# Patient Record
Sex: Female | Born: 1965 | Race: White | Hispanic: No | Marital: Married | Smoking: Current every day smoker
Health system: Southern US, Community
[De-identification: ages and names within clinical notes are randomized; demographics above are authoritative.]

## PROBLEM LIST (undated history)

## (undated) DIAGNOSIS — M1990 Unspecified osteoarthritis, unspecified site: Secondary | ICD-10-CM

## (undated) DIAGNOSIS — Z973 Presence of spectacles and contact lenses: Secondary | ICD-10-CM

## (undated) DIAGNOSIS — D649 Anemia, unspecified: Secondary | ICD-10-CM

## (undated) DIAGNOSIS — F32A Depression, unspecified: Secondary | ICD-10-CM

## (undated) DIAGNOSIS — K8000 Calculus of gallbladder with acute cholecystitis without obstruction: Secondary | ICD-10-CM

## (undated) DIAGNOSIS — T7840XA Allergy, unspecified, initial encounter: Secondary | ICD-10-CM

## (undated) DIAGNOSIS — F419 Anxiety disorder, unspecified: Secondary | ICD-10-CM

## (undated) DIAGNOSIS — K5790 Diverticulosis of intestine, part unspecified, without perforation or abscess without bleeding: Secondary | ICD-10-CM

## (undated) DIAGNOSIS — J449 Chronic obstructive pulmonary disease, unspecified: Secondary | ICD-10-CM

## (undated) DIAGNOSIS — N879 Dysplasia of cervix uteri, unspecified: Secondary | ICD-10-CM

## (undated) DIAGNOSIS — J302 Other seasonal allergic rhinitis: Secondary | ICD-10-CM

## (undated) DIAGNOSIS — F329 Major depressive disorder, single episode, unspecified: Secondary | ICD-10-CM

## (undated) DIAGNOSIS — K219 Gastro-esophageal reflux disease without esophagitis: Secondary | ICD-10-CM

## (undated) DIAGNOSIS — J45909 Unspecified asthma, uncomplicated: Secondary | ICD-10-CM

## (undated) DIAGNOSIS — G8929 Other chronic pain: Secondary | ICD-10-CM

## (undated) DIAGNOSIS — K529 Noninfective gastroenteritis and colitis, unspecified: Secondary | ICD-10-CM

## (undated) DIAGNOSIS — R87629 Unspecified abnormal cytological findings in specimens from vagina: Secondary | ICD-10-CM

## (undated) HISTORY — PX: CERVIX LESION DESTRUCTION: SHX591

## (undated) HISTORY — DX: Depression, unspecified: F32.A

## (undated) HISTORY — PX: CHOLECYSTECTOMY: SHX55

## (undated) HISTORY — DX: Diverticulosis of intestine, part unspecified, without perforation or abscess without bleeding: K57.90

## (undated) HISTORY — DX: Unspecified abnormal cytological findings in specimens from vagina: R87.629

## (undated) HISTORY — DX: Allergy, unspecified, initial encounter: T78.40XA

## (undated) HISTORY — DX: Chronic obstructive pulmonary disease, unspecified: J44.9

## (undated) HISTORY — DX: Major depressive disorder, single episode, unspecified: F32.9

## (undated) HISTORY — DX: Gastro-esophageal reflux disease without esophagitis: K21.9

## (undated) HISTORY — PX: COLONOSCOPY WITH PROPOFOL: SHX5780

## (undated) HISTORY — DX: Anxiety disorder, unspecified: F41.9

## (undated) HISTORY — DX: Calculus of gallbladder with acute cholecystitis without obstruction: K80.00

## (undated) HISTORY — PX: TUBAL LIGATION: SHX77

---
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
## 1993-08-03 HISTORY — PX: CERVIX LESION DESTRUCTION: SHX591

## 1994-08-03 HISTORY — PX: TUBAL LIGATION: SHX77

## 2010-12-10 ENCOUNTER — Emergency Department: Payer: Self-pay | Admitting: Emergency Medicine

## 2012-03-29 ENCOUNTER — Ambulatory Visit: Payer: Self-pay | Admitting: Family Medicine

## 2012-08-03 HISTORY — PX: CHOLECYSTECTOMY: SHX55

## 2013-01-03 ENCOUNTER — Other Ambulatory Visit: Payer: Self-pay | Admitting: General Surgery

## 2013-01-03 ENCOUNTER — Ambulatory Visit (INDEPENDENT_AMBULATORY_CARE_PROVIDER_SITE_OTHER): Payer: Medicaid Other | Admitting: General Surgery

## 2013-01-03 ENCOUNTER — Encounter: Payer: Self-pay | Admitting: General Surgery

## 2013-01-03 VITALS — BP 130/72 | HR 72 | Temp 100.5°F | Resp 12 | Ht 63.0 in | Wt 192.0 lb

## 2013-01-03 DIAGNOSIS — K8000 Calculus of gallbladder with acute cholecystitis without obstruction: Secondary | ICD-10-CM

## 2013-01-03 DIAGNOSIS — R112 Nausea with vomiting, unspecified: Secondary | ICD-10-CM

## 2013-01-03 HISTORY — DX: Calculus of gallbladder with acute cholecystitis without obstruction: K80.00

## 2013-01-03 MED ORDER — PROMETHAZINE HCL 25 MG PO TABS: 25.0000 mg | ORAL_TABLET | ORAL | Status: DC | PRN

## 2013-01-03 NOTE — Patient Instructions (Signed)
Patient advised that gallbladder is necessary at this time.   The patient will be scheduled for a cholecystectomy.

## 2013-01-03 NOTE — Progress Notes (Signed)
Patient ID: Kathy Garcia, female   DOB: 07-13-1966, 47 y.o.   MRN: [id number]  Chief Complaint  Patient presents with  . Pain    evaluation of gallbladder    HPI Kathy Garcia is a 47 y.o. female who presents for an evaluation of her gallbladder. She states she has been having abdominal pain for approximately 1 year but has gotten increasingly worse. She complains for right central abdominal pain, nausea, vomiting, and constipation. She states within in the last two weeks she has been in constant pain. The patient states she had an Ultrasound of the gallbladder at her primary care office on 12/15/12. The patient reports for the first year, she attributed it increasing reflux symptoms to a fatty food diet which she has since abandoned. In spite of that she have episodic pain 2 or 3 times a week that was fairly short lived in nature. The last 2 weeks this is become more constant right upper quadrant.  HPI  Past Medical History  Diagnosis Date  . GERD (gastroesophageal reflux disease)   . Allergy   . COPD (chronic obstructive pulmonary disease)   . Anxiety   . Depression     Past Surgical History  Procedure Laterality Date  . Tubal ligation      History reviewed. No pertinent family history.  Social History History  Substance Use Topics  . Smoking status: Former Smoker -- 1.00 packs/day for 20 years  . Smokeless tobacco: Not on file  . Alcohol Use: Yes    Allergies  Allergen Reactions  . Hydrocodone Nausea And Vomiting    Current Outpatient Prescriptions  Medication Sig Dispense Refill  . albuterol (PROVENTIL HFA;VENTOLIN HFA) 108 (90 BASE) MCG/ACT inhaler Inhale 2 puffs into the lungs every 4 (four) hours as needed for wheezing.      Marland Kitchen ALPRAZolam (XANAX) 0.5 MG tablet Take 0.5 mg by mouth 2 (two) times daily.      . beclomethasone (QVAR) 40 MCG/ACT inhaler Inhale 1 puff into the lungs 2 (two) times daily.      Marland Kitchen BIOTIN PO Take 1 tablet by mouth daily.      Marland Kitchen buPROPion  (WELLBUTRIN) 100 MG tablet Take 100 mg by mouth 2 (two) times daily.      . Cholecalciferol (VITAMIN D-3 PO) Take 1 tablet by mouth daily.      . meloxicam (MOBIC) 7.5 MG tablet Take 7.5 mg by mouth 2 (two) times daily.      . pantoprazole (PROTONIX) 40 MG tablet Take 40 mg by mouth daily.      . promethazine (PHENERGAN) 25 MG tablet Take 1 tablet (25 mg total) by mouth every 4 (four) hours as needed for nausea.  30 tablet  0   No current facility-administered medications for this visit.    Review of Systems Review of Systems  Constitutional: Positive for fever.  Respiratory: Negative.   Cardiovascular: Negative.   Gastrointestinal: Positive for nausea, vomiting and constipation.    Blood pressure 130/72, pulse 72, temperature 100.5 F (38.1 C), temperature source Oral, resp. rate 12, height 5\' 3"  (1.6 m), weight 192 lb (87.091 kg), last menstrual period 12/01/2012.  Physical Exam Physical Exam  Constitutional: She appears well-developed and well-nourished.  Eyes: Conjunctivae are normal. No scleral icterus.  Neck: Trachea normal. No mass and no thyromegaly present.  Cardiovascular: Normal rate, regular rhythm, normal heart sounds and normal pulses.   No murmur heard. Pulmonary/Chest: Effort normal and breath sounds normal.  Abdominal: Soft. Normal appearance and  bowel sounds are normal. There is tenderness in the right upper quadrant and right lower quadrant.   there is mild discomfort with firm pressure on the left side of the abdomen, but no referred pain.  Data Reviewed A single image from an outside ultrasound completed at her primary care physician's office dated 12/15/2012 shows multiple shadowing gallstones. No other images or reports are available at this time.    Assessment    Symptomatic cholelithiasis.    Plan    Arrangements have been made for cholecystectomy tomorrow. The plans for a laparoscopic procedure but the possibility of an open procedure as been  reviewed. The risks associated with surgery including bleeding, infection and biliary tract injury has been reviewed with the patient and her husband.  The patient was given a prescription for Percocet 5/325 with the inscription use one by mouth every 4 hours when necessary as needed for pain. A prescription for Phenergan was sent to her pharmacy electronically to be used as needed for nausea.       [name] 01/03/2013, 2:02 PM

## 2013-01-04 ENCOUNTER — Encounter: Payer: Self-pay | Admitting: General Surgery

## 2013-01-04 ENCOUNTER — Ambulatory Visit: Payer: Self-pay | Admitting: General Surgery

## 2013-01-04 DIAGNOSIS — K8010 Calculus of gallbladder with chronic cholecystitis without obstruction: Secondary | ICD-10-CM

## 2013-01-04 HISTORY — PX: CHOLECYSTECTOMY, LAPAROSCOPIC: SHX56

## 2013-01-04 LAB — CBC WITH DIFFERENTIAL/PLATELET
Basophil #: 0 10*3/uL (ref 0.0–0.1)
Eosinophil #: 0 10*3/uL (ref 0.0–0.7)
HCT: 32.6 % — ABNORMAL LOW (ref 35.0–47.0)
Lymphocyte #: 1.4 10*3/uL (ref 1.0–3.6)
Lymphocyte %: 14.2 %
MCH: 30.8 pg (ref 26.0–34.0)
MCHC: 33.2 g/dL (ref 32.0–36.0)
MCV: 93 fL (ref 80–100)
Monocyte #: 0.7 x10 3/mm (ref 0.2–0.9)
Neutrophil %: 77.8 %
RBC: 3.52 10*6/uL — ABNORMAL LOW (ref 3.80–5.20)
RDW: 12.8 % (ref 11.5–14.5)
WBC: 9.9 10*3/uL (ref 3.6–11.0)

## 2013-01-04 LAB — COMPREHENSIVE METABOLIC PANEL
Albumin: 3.4 g/dL (ref 3.4–5.0)
Anion Gap: 7 (ref 7–16)
BUN: 10 mg/dL (ref 7–18)
Bilirubin,Total: 0.4 mg/dL (ref 0.2–1.0)
Co2: 28 mmol/L (ref 21–32)
Osmolality: 279 (ref 275–301)
Total Protein: 7.8 g/dL (ref 6.4–8.2)

## 2013-01-05 LAB — PATHOLOGY REPORT

## 2013-01-09 ENCOUNTER — Ambulatory Visit: Payer: Self-pay | Admitting: General Surgery

## 2013-01-09 ENCOUNTER — Encounter: Payer: Self-pay | Admitting: General Surgery

## 2013-01-10 ENCOUNTER — Encounter: Payer: Self-pay | Admitting: General Surgery

## 2013-01-12 ENCOUNTER — Encounter: Payer: Self-pay | Admitting: General Surgery

## 2013-01-12 ENCOUNTER — Ambulatory Visit (INDEPENDENT_AMBULATORY_CARE_PROVIDER_SITE_OTHER): Payer: Medicaid Other | Admitting: General Surgery

## 2013-01-12 VITALS — BP 126/76 | HR 74 | Resp 16 | Ht 63.0 in | Wt 189.0 lb

## 2013-01-12 DIAGNOSIS — K8000 Calculus of gallbladder with acute cholecystitis without obstruction: Secondary | ICD-10-CM

## 2013-01-12 NOTE — Patient Instructions (Addendum)
Patient to return as needed. 

## 2013-01-12 NOTE — Progress Notes (Signed)
Patient ID: Kathy Garcia, female   DOB: Nov 28, 1965, 47 y.o.   MRN: [id number]  Chief Complaint  Patient presents with  . Routine Post Op    7-10 day post op gallbladder    HPI Kathy Garcia is a 47 y.o. female who presents for a 7-10 day post op gallbladder removal. She states she is having soreness a little pain especially at night. She states her bowels are moving but are not the consistency they usually are. She also is experiencing some nausea on a daily basis, markedly improved from prior to surgery.. She is still using her pain medication at night for comfort, primarily from the epigastric incision soreness. Occasional use of Phenergan is ongoing.  HPI  Past Medical History  Diagnosis Date  . GERD (gastroesophageal reflux disease)   . Allergy   . COPD (chronic obstructive pulmonary disease)   . Anxiety   . Depression   . Gallstones and inflammation of gallbladder without obstruction 01/03/2013    Past Surgical History  Procedure Laterality Date  . Tubal ligation    . Cholecystectomy  2014    History reviewed. No pertinent family history.  Social History History  Substance Use Topics  . Smoking status: Former Smoker -- 1.00 packs/day for 20 years  . Smokeless tobacco: Not on file  . Alcohol Use: Yes    Allergies  Allergen Reactions  . Hydrocodone Nausea And Vomiting    Current Outpatient Prescriptions  Medication Sig Dispense Refill  . albuterol (PROVENTIL HFA;VENTOLIN HFA) 108 (90 BASE) MCG/ACT inhaler Inhale 2 puffs into the lungs every 4 (four) hours as needed for wheezing.      Marland Kitchen ALPRAZolam (XANAX) 0.5 MG tablet Take 0.5 mg by mouth 2 (two) times daily.      . beclomethasone (QVAR) 40 MCG/ACT inhaler Inhale 1 puff into the lungs 2 (two) times daily.      Marland Kitchen BIOTIN PO Take 1 tablet by mouth daily.      Marland Kitchen buPROPion (WELLBUTRIN) 100 MG tablet Take 100 mg by mouth 2 (two) times daily.      . Cholecalciferol (VITAMIN D-3 PO) Take 1 tablet by mouth daily.      .  meloxicam (MOBIC) 7.5 MG tablet Take 7.5 mg by mouth 2 (two) times daily.      Marland Kitchen oxyCODONE-acetaminophen (PERCOCET/ROXICET) 5-325 MG per tablet Take 1 tablet by mouth every 4 (four) hours as needed for pain.      . pantoprazole (PROTONIX) 40 MG tablet Take 40 mg by mouth daily.      . promethazine (PHENERGAN) 25 MG tablet Take 1 tablet (25 mg total) by mouth every 4 (four) hours as needed for nausea.  30 tablet  0   No current facility-administered medications for this visit.    Review of Systems Review of Systems  Constitutional: Negative.   Respiratory: Negative.   Cardiovascular: Negative.   Gastrointestinal: Negative.     Blood pressure 126/76, pulse 74, resp. rate 16, height 5\' 3"  (1.6 m), weight 189 lb (85.73 kg), last menstrual period 12/01/2012.  Physical Exam Physical Exam  Constitutional: She appears well-developed and well-nourished.  Cardiovascular: Normal rate, regular rhythm and normal heart sounds.   Pulmonary/Chest: Effort normal and breath sounds normal.  Neurological: She is alert.   Abdominal examination shows all the port sites healing without evidence of inflammation or infection. The abdomen is soft and nontender.  Data Reviewed Pathology showed chronic cholecystitis and cholelithiasis.  Assessment    Good recovery post cholecystectomy.  Plan    The patient may return to work. 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       Greta Doom F 01/12/2013, 11:56 AM

## 2013-01-28 DIAGNOSIS — R1011 Right upper quadrant pain: Secondary | ICD-10-CM | POA: Insufficient documentation

## 2013-01-30 ENCOUNTER — Ambulatory Visit (INDEPENDENT_AMBULATORY_CARE_PROVIDER_SITE_OTHER): Payer: Medicaid Other | Admitting: General Surgery

## 2013-01-30 ENCOUNTER — Encounter: Payer: Self-pay | Admitting: General Surgery

## 2013-01-30 VITALS — BP 120/74 | HR 76 | Temp 97.9°F | Resp 12 | Ht 63.0 in | Wt 194.0 lb

## 2013-01-30 DIAGNOSIS — R1011 Right upper quadrant pain: Secondary | ICD-10-CM

## 2013-01-30 DIAGNOSIS — K8000 Calculus of gallbladder with acute cholecystitis without obstruction: Secondary | ICD-10-CM

## 2013-01-30 NOTE — Progress Notes (Signed)
Patient ID: Kathy Garcia, female   DOB: 1965-10-31, 47 y.o.   MRN: [id number]  Chief Complaint  Patient presents with  . Routine Post Op    gallbladder     HPI Kathy Garcia is a 47 y.o. female here today for her post op gallbladder surgery done on 01/04/13. Patient has COPD and has continued shortness of breath. She reports that yesterday she noted increased pain with deep inspiration in the right upper quadrant. This has since resolved. She is not appreciated a change in her pain with dietary intake. No difficulty with bowel function. She denies any black stools. She reported having some fevers and chills the last 48 hours although she had not taken her temperature.. The right upper quadrant pain is new from her January 12, 2013 postoperative examination. She is accompanied today by a family member. The patient reports that she had not been making use of her Protonix as the prescription ran out about 3 weeks ago. She was however making use of both Mobic and ibuprofen simultaneously.  HPI  Past Medical History  Diagnosis Date  . GERD (gastroesophageal reflux disease)   . Allergy   . COPD (chronic obstructive pulmonary disease)   . Anxiety   . Depression   . Gallstones and inflammation of gallbladder without obstruction 01/03/2013    Past Surgical History  Procedure Laterality Date  . Tubal ligation    . Cholecystectomy  2014    No family history on file.  Social History History  Substance Use Topics  . Smoking status: Former Smoker -- 1.00 packs/day for 20 years  . Smokeless tobacco: Not on file  . Alcohol Use: Yes    Allergies  Allergen Reactions  . Hydrocodone Nausea And Vomiting    Current Outpatient Prescriptions  Medication Sig Dispense Refill  . albuterol (PROVENTIL HFA;VENTOLIN HFA) 108 (90 BASE) MCG/ACT inhaler Inhale 2 puffs into the lungs every 4 (four) hours as needed for wheezing.      Marland Kitchen ALPRAZolam (XANAX) 0.5 MG tablet Take 0.5 mg by mouth 2 (two) times daily.       . beclomethasone (QVAR) 40 MCG/ACT inhaler Inhale 1 puff into the lungs 2 (two) times daily.      Marland Kitchen BIOTIN PO Take 1 tablet by mouth daily.      Marland Kitchen buPROPion (WELLBUTRIN) 100 MG tablet Take 100 mg by mouth 2 (two) times daily.      . Cholecalciferol (VITAMIN D-3 PO) Take 1 tablet by mouth daily.      Marland Kitchen ibuprofen (ADVIL,MOTRIN) 100 MG tablet Take 100 mg by mouth every 8 (eight) hours as needed for fever.      . meloxicam (MOBIC) 7.5 MG tablet Take 7.5 mg by mouth 2 (two) times daily.      . pantoprazole (PROTONIX) 40 MG tablet Take 40 mg by mouth daily.      . promethazine (PHENERGAN) 25 MG tablet Take 1 tablet (25 mg total) by mouth every 4 (four) hours as needed for nausea.  30 tablet  0   No current facility-administered medications for this visit.    Review of Systems Review of Systems  Constitutional: Negative.   Respiratory: Negative.   Cardiovascular: Negative.   Gastrointestinal: Positive for abdominal pain. Negative for vomiting, diarrhea, constipation, blood in stool, abdominal distention, anal bleeding and rectal pain.    Blood pressure 120/74, pulse 76, temperature 97.9 F (36.6 C), resp. rate 12, height 5\' 3"  (1.6 m), weight 194 lb (87.998 kg), last menstrual period 12/01/2012,  SpO2 97.00%.  Physical Exam Physical Exam  Constitutional: She is oriented to person, place, and time. She appears well-developed and well-nourished.  Eyes: No scleral icterus.  Cardiovascular: Normal rate and regular rhythm.   Pulmonary/Chest: Breath sounds normal. No accessory muscle usage. Not tachypneic. No respiratory distress. She exhibits no tenderness.  No pleural friction rub.  Abdominal: There is no hepatomegaly.     Tenderness just below the right costal margin in the within the body of the rectus muscle. The degree of tenderness is unchanged whether her legs are at rest or elevated off the examining table.  Lymphadenopathy:    She has no cervical adenopathy.  Neurological: She is  alert and oriented to person, place, and time.  Skin: Skin is warm and dry.    Data Reviewed Cholangiogram is completed at the time of her procedure were normal.  Assessment    Musculoskeletal pain.  Overuse of anti-inflammatories.  Discontinuation of PPI possibly exacerbated nausea.     Plan    The patient was encouraged to refill her Protonix prescription and to choose either ibuprofen or neurologic cancer anti-inflammatory effect. Local heat at the site of soreness was encouraged. I would expect her to show improvement in the next 2-3 days.          [name] 01/30/2013, 9:04 PM

## 2013-01-30 NOTE — Patient Instructions (Addendum)
The patient is aware to use a heating pad as needed for comfort.  

## 2014-01-03 DIAGNOSIS — K529 Noninfective gastroenteritis and colitis, unspecified: Secondary | ICD-10-CM | POA: Insufficient documentation

## 2014-01-03 DIAGNOSIS — K219 Gastro-esophageal reflux disease without esophagitis: Secondary | ICD-10-CM | POA: Insufficient documentation

## 2014-06-04 ENCOUNTER — Encounter: Payer: Self-pay | Admitting: General Surgery

## 2014-11-23 NOTE — Op Note (Signed)
PATIENT NAME:  Kathy RamusSMITHEY, Rhyder B MR#:  [id number] DATE OF BIRTH:  [birth year]  DATE OF PROCEDURE:  01/04/2013  PREOPERATIVE DIAGNOSIS: Chronic cholecystitis and cholelithiasis.   POSTOPERATIVE DIAGNOSIS: Chronic cholecystitis and cholelithiasis.   OPERATIVE PROCEDURE: Laparoscopic cholecystectomy with intraoperative cholangiogram.   OPERATING SURGEON: Earline MayotteJeffrey W. Mariyam Remington, M.D.   ANESTHESIA: General endotracheal under Dr. Maisie Fushomas.   ESTIMATED BLOOD LOSS: Less than 5 mL.   CLINICAL NOTE: This woman had a 2122-month history of episodic abdominal pain usually prompted by fried foods. In the last 2 weeks, she has had increasing and constant pain associated with fever and chills. She is admitted at this time for planned cholecystectomy after ultrasound obtained in a private practice office showed evidence of cholelithiasis.   OPERATIVE NOTE: With the patient under adequate general endotracheal anesthesia, the abdomen was prepped with ChloraPrep and draped. In Trendelenburg position, a Veress needle was placed through a transumbilical incision. After assuring intra-abdominal location with the hanging drop test, the abdomen was insufflated with CO2 at 10 mmHg pressure. This was later increased to 15 mmHg pressure to provide better exposure. An 11 mm Xcel port was placed in the epigastrium and two 5 mm step ports placed laterally under direct vision. Examination of the abdomen was notable for some filmy adhesions to the neck of the gallbladder and an enlarged cystic duct lymph node. The gallbladder was placed on cephalad traction. An orogastric tube was placed by the nurse anesthetist to provide better visualization. The neck of the gallbladder was cleared and the cystic duct identified. A Kumar clamp was applied and fluoroscopic cholangiogram was completed using 20 mL of one-half strength Conray 60. This showed prompt filling of the right and left hepatic ducts and free flow into the duodenum. No evidence of  retained stones. The cystic duct and branches of the cystic artery were doubly clipped and divided. The gallbladder was then removed from the liver bed making use of hook cautery dissection. It was delivered through the umbilical port site. Multiple small pigment stones were identified. No purulence was noted. After re-establishing pneumoperitoneum, the right upper quadrant was irrigated with lactated Ringer solution and good hemostasis appreciated. Attention was turned to the right lower quadrant where the patient had shown some tenderness on yesterday's exam, and there was no evidence of inflammation involving the right colon or cecum. The appendix could not be visualized. The terminal ileum was unremarkable. An 0 Maxon suture was used to close the fascial defect at the umbilicus. The abdomen was then desufflated under direct vision and ports removed. Incisions were closed with 4-0 Vicryl subcuticular suture. Benzoin, Steri-Strips, Telfa and Tegaderm dressing was then applied.   The patient tolerated the procedure well and was taken to the recovery room in stable condition.   ____________________________ Earline MayotteJeffrey W. Maguadalupe Lata, MD jwb:gb D: 01/04/2013 21:22:00 ET T: 01/04/2013 21:47:07 ET JOB#: [id number]  cc: Earline MayotteJeffrey W. Alira Fretwell, MD, <Dictator> Meindert A. Lacie ScottsNiemeyer, MD [employer] Jessina Marse MD ELECTRONICALLY SIGNED 01/05/2013 15:02

## 2017-06-16 ENCOUNTER — Encounter: Payer: Self-pay | Admitting: Emergency Medicine

## 2017-06-16 ENCOUNTER — Emergency Department
Admission: EM | Admit: 2017-06-16 | Discharge: 2017-06-16 | Disposition: A | Discharge status: Home / Self Care | Payer: Self-pay | Attending: Emergency Medicine | Admitting: Emergency Medicine

## 2017-06-16 ENCOUNTER — Emergency Department: Payer: Self-pay

## 2017-06-16 DIAGNOSIS — Z87891 Personal history of nicotine dependence: Secondary | ICD-10-CM | POA: Insufficient documentation

## 2017-06-16 DIAGNOSIS — J449 Chronic obstructive pulmonary disease, unspecified: Secondary | ICD-10-CM | POA: Insufficient documentation

## 2017-06-16 DIAGNOSIS — Z791 Long term (current) use of non-steroidal anti-inflammatories (NSAID): Secondary | ICD-10-CM | POA: Insufficient documentation

## 2017-06-16 DIAGNOSIS — Z79899 Other long term (current) drug therapy: Secondary | ICD-10-CM | POA: Insufficient documentation

## 2017-06-16 DIAGNOSIS — R079 Chest pain, unspecified: Secondary | ICD-10-CM | POA: Insufficient documentation

## 2017-06-16 LAB — BASIC METABOLIC PANEL
Anion gap: 9 (ref 5–15)
BUN: 21 mg/dL — ABNORMAL HIGH (ref 6–20)
CALCIUM: 8.9 mg/dL (ref 8.9–10.3)
CHLORIDE: 105 mmol/L (ref 101–111)
CO2: 25 mmol/L (ref 22–32)
CREATININE: 0.92 mg/dL (ref 0.44–1.00)
GFR calc Af Amer: >60 mL/min (ref >60)
GFR calc non Af Amer: >60 mL/min (ref >60)
GLUCOSE: 110 mg/dL — AB (ref 65–99)
Potassium: 3.7 mmol/L (ref 3.5–5.1)
Sodium: 139 mmol/L (ref 135–145)

## 2017-06-16 LAB — CBC
HCT: 39.4 % (ref 35.0–47.0)
HEMOGLOBIN: 13 g/dL (ref 12.0–16.0)
MCH: 32 pg (ref 26.0–34.0)
MCHC: 33 g/dL (ref 32.0–36.0)
MCV: 96.9 fL (ref 80.0–100.0)
Platelets: 246 10*3/uL (ref 150–440)
RBC: 4.06 MIL/uL (ref 3.80–5.20)
RDW: 12.9 % (ref 11.5–14.5)
WBC: 9.3 10*3/uL (ref 3.6–11.0)

## 2017-06-16 LAB — TROPONIN I

## 2017-06-16 NOTE — Discharge Instructions (Signed)
You have been seen in the emergency department today for chest pain. Your workup has shown normal results. As we discussed please follow-up with your primary care physician in the next 1-2 days for recheck. Return to the emergency department for any further chest pain, trouble breathing, or any other symptom personally concerning to yourself.  Please call the number provided for cardiology to arrange a follow-up appointment for a stress test as soon as possible.

## 2017-06-16 NOTE — ED Notes (Signed)
Pt presents with cp and sob x 1 week. States it was "very intense" on Wednesday and she felt like she couldn't breathe. She states it lasted 4 or 5 minutes and stopped. Pt states she has had continued sob and moderate cp since then. Pt states that she has had panic attacks in the past, but this feels different. She also reports that her right arm has been giving her pain for several months, as well as numbness in her fingers.

## 2017-06-16 NOTE — ED Provider Notes (Signed)
[facility] Emergency Department Provider Note  Time seen: 6:23 PM  I have reviewed the triage vital signs and the nursing notes.   HISTORY  Chief Complaint Chest Pain    HPI Kathy Garcia is a 51 y.o. female with a past medical history of anxiety, COPD, depression, gastric reflux, presents to the emergency department for chest pain.  According to the patient over the past 1 week she has been experiencing intermittent chest pain.  Denies any nausea, or diaphoresis.  She does have occasional shortness of breath when the pain occurs.  Denies any inciting event such as eating, exertion, movement or deep breathing.  Patient states her supervisor asked her to go to the emergency department to be evaluated.  Denies any significant discomfort currently.  Denies any leg pain or swelling.  Denies any shortness of breath.  Past Medical History:  Diagnosis Date  . Allergy   . Anxiety   . COPD (chronic obstructive pulmonary disease) (HCC)   . Depression   . Gallstones and inflammation of gallbladder without obstruction 01/03/2013  . GERD (gastroesophageal reflux disease)     Patient Active Problem List   Diagnosis Date Noted  . Abdominal pain, right upper quadrant 01/28/2013  . Gallstones and inflammation of gallbladder without obstruction 01/03/2013    Past Surgical History:  Procedure Laterality Date  . CHOLECYSTECTOMY  2014  . TUBAL LIGATION      Prior to Admission medications   Medication Sig Start Date End Date Taking? Authorizing Provider  albuterol (PROVENTIL HFA;VENTOLIN HFA) 108 (90 BASE) MCG/ACT inhaler Inhale 2 puffs into the lungs every 4 (four) hours as needed for wheezing.    [provider]  ALPRAZolam Prudy Feeler(XANAX) 0.5 MG tablet Take 0.5 mg by mouth 2 (two) times daily.    [provider]  beclomethasone (QVAR) 40 MCG/ACT inhaler Inhale 1 puff into the lungs 2 (two) times daily.    [provider]  BIOTIN PO Take 1 tablet by  mouth daily.    [provider]  buPROPion (WELLBUTRIN) 100 MG tablet Take 100 mg by mouth 2 (two) times daily.    [provider]  Cholecalciferol (VITAMIN D-3 PO) Take 1 tablet by mouth daily.    [provider]  ibuprofen (ADVIL,MOTRIN) 100 MG tablet Take 100 mg by mouth every 8 (eight) hours as needed for fever.    [provider]  meloxicam (MOBIC) 7.5 MG tablet Take 7.5 mg by mouth 2 (two) times daily.    [provider]  pantoprazole (PROTONIX) 40 MG tablet Take 40 mg by mouth daily.    [provider]  promethazine (PHENERGAN) 25 MG tablet Take 1 tablet (25 mg total) by mouth every 4 (four) hours as needed for nausea. 01/03/13   Earline MayotteByrnett, Jeffrey W, MD    Allergies  Allergen Reactions  . Hydrocodone Nausea And Vomiting    No family history on file.  Social History Social History   Tobacco Use  . Smoking status: Former Smoker    Packs/day: 1.00    Years: 20.00    Pack years: 20.00  . Smokeless tobacco: Never Used  Substance Use Topics  . Alcohol use: Yes  . Drug use: No    Review of Systems Constitutional: Negative for fever. Cardiovascular: Positive for intermittent chest pain over the past 1 week Respiratory: Occasional shortness of breath when the pain occurs, none currently. Gastrointestinal: Negative for abdominal pain, vomiting Musculoskeletal: Negative for leg pain or swelling All other  ROS negative  ____________________________________________   PHYSICAL EXAM:  VITAL SIGNS: ED Triage Vitals  Enc Vitals Group     BP 06/16/17 1708 (!) 145/88     Pulse Rate 06/16/17 1708 94     Resp 06/16/17 1708 20     Temp 06/16/17 1708 99.1 F (37.3 C)     Temp Source 06/16/17 1708 Oral     SpO2 06/16/17 1708 97 %     Weight 06/16/17 1705 190 lb (86.2 kg)     Height 06/16/17 1705 5\' 3"  (1.6 m)     Head Circumference --      Peak Flow --      Pain Score 06/16/17 1705 7     Pain Loc --      Pain Edu? --       Excl. in GC? --    Constitutional: Alert and oriented. Well appearing and in no distress. Eyes: Normal exam ENT   Head: Normocephalic and atraumatic.   Mouth/Throat: Mucous membranes are moist. Cardiovascular: Normal rate, regular rhythm. No murmur Respiratory: Normal respiratory effort without tachypnea nor retractions. Breath sounds are clear  Gastrointestinal: Soft and nontender. No distention.   Musculoskeletal: Nontender with normal range of motion in all extremities. No lower extremity tenderness or edema. Neurologic:  Normal speech and language. No gross focal neurologic deficits Skin:  Skin is warm, dry and intact.  Psychiatric: Mood and affect are normal.   ____________________________________________    EKG  EKG reviewed and interpreted by myself shows normal sinus rhythm at 90 bpm, narrow QRS, normal axis, normal intervals, no concerning ST changes.  ____________________________________________    RADIOLOGY  Chest x-ray negative  ____________________________________________   INITIAL IMPRESSION / ASSESSMENT AND PLAN / ED COURSE  Pertinent labs & imaging results that were available during my care of the patient were reviewed by me and considered in my medical decision making (see chart for details).  Patient presents to the emergency department for intermittent chest pain over the past 1 week.  Differential would include ACS, chest wall pain, angina, pneumonia, pneumothorax.  Overall the patient appears extremely well with a normal physical examination.  Blood work is reassuring including a negative troponin.  Chest x-ray is normal.  EKG is normal.  I reviewed the patient's records including care everywhere, largely noncontributory to today's visit, besides possibly a note from 2015 showing significant gastric reflux seen by GI.  The patient's workup and exam are normal and the patient is currently symptom-free I believe she is safe for discharge home.   However given the patient's frequency of symptoms over the past 1 week I discussed with her following up with cardiology for a stress test.  Patient is agreeable to this plan of care and will call tomorrow to arrange the next available stress test.  I provided by normal chest pain return precautions.  ____________________________________________   FINAL CLINICAL IMPRESSION(S) / ED DIAGNOSES  Chest pain    Minna AntisPaduchowski, Kahlin Mark, MD 06/16/17 1827

## 2017-06-16 NOTE — ED Notes (Signed)
PT ambulatory at time of discharge and in NAD.

## 2017-06-16 NOTE — ED Triage Notes (Signed)
Pt comes into the ED via POV c/o central chest pain x1 week.  Patient states she does have shortness of breath with the pain.  The pain is described as squeezing pressure.  Patient has a h/o anxiety and recently lost her husband and has had increased stress.  Denies any cardiac problems.  Patient is ambulatory to triage at this time with even and unlabored respirations.  Denies any dizziness, N/V.

## 2017-09-13 ENCOUNTER — Encounter: Payer: Self-pay | Admitting: Emergency Medicine

## 2017-09-13 ENCOUNTER — Emergency Department
Admission: EM | Admit: 2017-09-13 | Discharge: 2017-09-13 | Disposition: A | Discharge status: Home / Self Care | Payer: No Typology Code available for payment source | Attending: Emergency Medicine | Admitting: Emergency Medicine

## 2017-09-13 ENCOUNTER — Emergency Department: Payer: No Typology Code available for payment source

## 2017-09-13 ENCOUNTER — Other Ambulatory Visit: Payer: Self-pay

## 2017-09-13 DIAGNOSIS — Y999 Unspecified external cause status: Secondary | ICD-10-CM | POA: Diagnosis not present

## 2017-09-13 DIAGNOSIS — Y9389 Activity, other specified: Secondary | ICD-10-CM | POA: Insufficient documentation

## 2017-09-13 DIAGNOSIS — Z87891 Personal history of nicotine dependence: Secondary | ICD-10-CM | POA: Insufficient documentation

## 2017-09-13 DIAGNOSIS — Y92410 Unspecified street and highway as the place of occurrence of the external cause: Secondary | ICD-10-CM | POA: Insufficient documentation

## 2017-09-13 DIAGNOSIS — Z79899 Other long term (current) drug therapy: Secondary | ICD-10-CM | POA: Diagnosis not present

## 2017-09-13 DIAGNOSIS — S161XXA Strain of muscle, fascia and tendon at neck level, initial encounter: Secondary | ICD-10-CM | POA: Diagnosis not present

## 2017-09-13 DIAGNOSIS — J449 Chronic obstructive pulmonary disease, unspecified: Secondary | ICD-10-CM | POA: Insufficient documentation

## 2017-09-13 DIAGNOSIS — S199XXA Unspecified injury of neck, initial encounter: Secondary | ICD-10-CM | POA: Diagnosis present

## 2017-09-13 MED ORDER — METHYLPREDNISOLONE 4 MG PO TBPK: ORAL_TABLET | ORAL | 0 refills | Status: DC

## 2017-09-13 MED ORDER — METHOCARBAMOL 500 MG PO TABS: 500.0000 mg | ORAL_TABLET | Freq: Four times a day (QID) | ORAL | 0 refills | Status: DC

## 2017-09-13 NOTE — Discharge Instructions (Signed)
Follow up with Dr Odis LusterBowers if your neck is not better in 7-10 days.  Use the medication as prescribed.  Apply ice to any areas that hurt.  In 3 days you can switch to wet heat followed by ice.  Be active to help decrease the muscle soreness.  Return to the ER if you are worsening

## 2017-09-13 NOTE — ED Notes (Signed)
Pt given cup of ice and bed repositioned.
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
## 2019-02-01 ENCOUNTER — Other Ambulatory Visit: Payer: Self-pay

## 2019-02-01 ENCOUNTER — Emergency Department: Payer: No Typology Code available for payment source

## 2019-02-01 ENCOUNTER — Encounter: Payer: Self-pay | Admitting: Emergency Medicine

## 2019-02-01 ENCOUNTER — Emergency Department
Admission: EM | Admit: 2019-02-01 | Discharge: 2019-02-01 | Disposition: A | Discharge status: Home / Self Care | Payer: No Typology Code available for payment source | Attending: Emergency Medicine | Admitting: Emergency Medicine

## 2019-02-01 DIAGNOSIS — Z87891 Personal history of nicotine dependence: Secondary | ICD-10-CM | POA: Diagnosis not present

## 2019-02-01 DIAGNOSIS — N3090 Cystitis, unspecified without hematuria: Secondary | ICD-10-CM

## 2019-02-01 DIAGNOSIS — Z79899 Other long term (current) drug therapy: Secondary | ICD-10-CM | POA: Diagnosis not present

## 2019-02-01 DIAGNOSIS — Y9389 Activity, other specified: Secondary | ICD-10-CM | POA: Diagnosis not present

## 2019-02-01 DIAGNOSIS — M7918 Myalgia, other site: Secondary | ICD-10-CM | POA: Insufficient documentation

## 2019-02-01 DIAGNOSIS — Y999 Unspecified external cause status: Secondary | ICD-10-CM | POA: Insufficient documentation

## 2019-02-01 DIAGNOSIS — J449 Chronic obstructive pulmonary disease, unspecified: Secondary | ICD-10-CM | POA: Diagnosis not present

## 2019-02-01 DIAGNOSIS — R1030 Lower abdominal pain, unspecified: Secondary | ICD-10-CM | POA: Diagnosis present

## 2019-02-01 DIAGNOSIS — N3091 Cystitis, unspecified with hematuria: Secondary | ICD-10-CM | POA: Diagnosis not present

## 2019-02-01 LAB — URINALYSIS, COMPLETE (UACMP) WITH MICROSCOPIC
Bacteria, UA: NONE SEEN
Bilirubin Urine: NEGATIVE
Glucose, UA: NEGATIVE mg/dL
Ketones, ur: NEGATIVE mg/dL
Nitrite: NEGATIVE
Protein, ur: 100 mg/dL — AB
RBC / HPF: >50 RBC/hpf — ABNORMAL HIGH (ref 0–5)
Specific Gravity, Urine: 1.013 (ref 1.005–1.030)
WBC, UA: >50 WBC/hpf — ABNORMAL HIGH (ref 0–5)
pH: 7 (ref 5.0–8.0)

## 2019-02-01 LAB — CBC WITH DIFFERENTIAL/PLATELET
Abs Immature Granulocytes: 0.02 10*3/uL (ref 0.00–0.07)
Basophils Absolute: 0 10*3/uL (ref 0.0–0.1)
Basophils Relative: 0 %
Eosinophils Absolute: 0.2 10*3/uL (ref 0.0–0.5)
Eosinophils Relative: 2 %
HCT: 34.6 % — ABNORMAL LOW (ref 36.0–46.0)
Hemoglobin: 11.1 g/dL — ABNORMAL LOW (ref 12.0–15.0)
Immature Granulocytes: 0 %
Lymphocytes Relative: 34 %
Lymphs Abs: 3.6 10*3/uL (ref 0.7–4.0)
MCH: 31.8 pg (ref 26.0–34.0)
MCHC: 32.1 g/dL (ref 30.0–36.0)
MCV: 99.1 fL (ref 80.0–100.0)
Monocytes Absolute: 0.6 10*3/uL (ref 0.1–1.0)
Monocytes Relative: 6 %
Neutro Abs: 6.3 10*3/uL (ref 1.7–7.7)
Neutrophils Relative %: 58 %
Platelets: 212 10*3/uL (ref 150–400)
RBC: 3.49 MIL/uL — ABNORMAL LOW (ref 3.87–5.11)
RDW: 13 % (ref 11.5–15.5)
WBC: 10.7 10*3/uL — ABNORMAL HIGH (ref 4.0–10.5)
nRBC: 0 % (ref 0.0–0.2)

## 2019-02-01 LAB — COMPREHENSIVE METABOLIC PANEL
ALT: 22 U/L (ref 0–44)
AST: 22 U/L (ref 15–41)
Albumin: 4.2 g/dL (ref 3.5–5.0)
Alkaline Phosphatase: 83 U/L (ref 38–126)
Anion gap: 7 (ref 5–15)
BUN: 18 mg/dL (ref 6–20)
CO2: 27 mmol/L (ref 22–32)
Calcium: 8.7 mg/dL — ABNORMAL LOW (ref 8.9–10.3)
Chloride: 106 mmol/L (ref 98–111)
Creatinine, Ser: 0.77 mg/dL (ref 0.44–1.00)
GFR calc Af Amer: >60 mL/min (ref >60)
GFR calc non Af Amer: >60 mL/min (ref >60)
Glucose, Bld: 88 mg/dL (ref 70–99)
Potassium: 4.2 mmol/L (ref 3.5–5.1)
Sodium: 140 mmol/L (ref 135–145)
Total Bilirubin: 0.5 mg/dL (ref 0.3–1.2)
Total Protein: 6.7 g/dL (ref 6.5–8.1)

## 2019-02-01 MED ORDER — IOHEXOL 300 MG/ML  SOLN
100.0000 mL | Freq: Once | INTRAMUSCULAR | Status: AC | PRN
  Administered 2019-02-01: 100 mL via INTRAVENOUS
  Filled 2019-02-01: qty 100

## 2019-02-01 MED ORDER — MELOXICAM 15 MG PO TABS: 15.0000 mg | ORAL_TABLET | Freq: Every day | ORAL | 1 refills | Status: AC

## 2019-02-01 MED ORDER — CEPHALEXIN 500 MG PO CAPS: 500.0000 mg | ORAL_CAPSULE | Freq: Three times a day (TID) | ORAL | 0 refills | Status: AC

## 2019-02-01 MED ORDER — METHOCARBAMOL 500 MG PO TABS: 500.0000 mg | ORAL_TABLET | Freq: Three times a day (TID) | ORAL | 0 refills | Status: AC | PRN
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
## 2019-02-01 NOTE — ED Notes (Signed)
Patient given discharge instructions, paperwork. Patient asking about cost of medications; this RN advised patient to check [employer] for coupons that can be used to lower prices
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
## 2019-02-01 NOTE — ED Notes (Signed)
Patient transported to X-ray 

## 2019-02-03 LAB — URINE CULTURE

## 2020-04-29 ENCOUNTER — Telehealth: Payer: Self-pay | Admitting: *Deleted

## 2020-04-29 NOTE — Telephone Encounter (Signed)
Pt called schedule new patient appointment 06/13/20.  She stated she went to [facility] 9/8.  For vomiting/diarrhea  No gallbladder.  She stated they told her she had lot potassium She stated her stools are never hard. (like jam) she has been taking iron

## 2020-04-29 NOTE — Telephone Encounter (Signed)
If she is limited to Thursday and Friday appointments she may want to consider a different provider since I'm not in the office on Fridays.

## 2020-04-29 NOTE — Telephone Encounter (Signed)
And this weekend she had abd pain.  Pt wanted to know if she could be worked in.  Pt only wants Thursday Friday appointments.  Or what do you recommend she do until her appointment here   LOW Potassium (not lot potassium)

## 2020-05-01 NOTE — Telephone Encounter (Signed)
Kathy Garcia Can pt be worked in for new patient appointment with you

## 2020-05-01 NOTE — Telephone Encounter (Signed)
Next available, not working her in.

## 2020-05-01 NOTE — Telephone Encounter (Signed)
Left message asking pt to call office  °

## 2020-06-13 ENCOUNTER — Ambulatory Visit (INDEPENDENT_AMBULATORY_CARE_PROVIDER_SITE_OTHER): Payer: Self-pay | Admitting: Family Medicine

## 2020-06-13 ENCOUNTER — Encounter: Payer: Self-pay | Admitting: Family Medicine

## 2020-06-13 ENCOUNTER — Other Ambulatory Visit: Payer: Self-pay

## 2020-06-13 VITALS — BP 120/68 | HR 71 | Temp 97.4°F | Ht 63.5 in | Wt 186.5 lb

## 2020-06-13 DIAGNOSIS — I10 Essential (primary) hypertension: Secondary | ICD-10-CM

## 2020-06-13 DIAGNOSIS — E669 Obesity, unspecified: Secondary | ICD-10-CM

## 2020-06-13 DIAGNOSIS — G43909 Migraine, unspecified, not intractable, without status migrainosus: Secondary | ICD-10-CM | POA: Insufficient documentation

## 2020-06-13 DIAGNOSIS — F331 Major depressive disorder, recurrent, moderate: Secondary | ICD-10-CM

## 2020-06-13 DIAGNOSIS — F329 Major depressive disorder, single episode, unspecified: Secondary | ICD-10-CM | POA: Insufficient documentation

## 2020-06-13 DIAGNOSIS — F411 Generalized anxiety disorder: Secondary | ICD-10-CM

## 2020-06-13 DIAGNOSIS — E66811 Obesity, class 1: Secondary | ICD-10-CM

## 2020-06-13 DIAGNOSIS — K529 Noninfective gastroenteritis and colitis, unspecified: Secondary | ICD-10-CM

## 2020-06-13 DIAGNOSIS — J449 Chronic obstructive pulmonary disease, unspecified: Secondary | ICD-10-CM

## 2020-06-13 DIAGNOSIS — Z13220 Encounter for screening for lipoid disorders: Secondary | ICD-10-CM

## 2020-06-13 MED ORDER — ALBUTEROL SULFATE HFA 108 (90 BASE) MCG/ACT IN AERS: 2.0000 | INHALATION_SPRAY | RESPIRATORY_TRACT | 1 refills | Status: DC | PRN

## 2020-06-13 NOTE — Assessment & Plan Note (Signed)
Notes last treated >5 years and cost is a major concern. She is managing OK but will potentially be interested in treatment when she has insurance. Will follow-up in 3 months

## 2020-06-13 NOTE — Assessment & Plan Note (Addendum)
Rare breathing events. Interested in quitting smoking. Vomiting with coughing fits - advised trying albuterol prn to see if that improves/reduces symptoms

## 2020-06-13 NOTE — Patient Instructions (Signed)
Diarrhea - would recommend we work this up - Call to schedule a lab appointment once you have insurance   Make a f/u visit after you schedule the labs

## 2020-06-13 NOTE — Assessment & Plan Note (Signed)
Notes diarrhea x 5+ years since gallbladder removal. Discussed getting work-up to r/u celiac disease and Inflammatory bowel disease. At this point lower suspicion for infection given duration of symptoms. And will evaluate for thyroid and liver disease. Due to cost will postpone evaluation for 3 months. GI referral of no cause determined.

## 2020-06-13 NOTE — Assessment & Plan Note (Signed)
BP normal today, but notes elevated at home and does have prior elevated numbers. She will defer bloodwork due to cost and check in 3 months w/ f/u visit at that time to recheck.

## 2020-06-13 NOTE — Assessment & Plan Note (Signed)
Previously on xanax but concerns over teenagers possibly taking her pills so stopped. Still with significant anxiety, but due to insurance would like to defer treatment. F/u 3 months

## 2020-06-13 NOTE — Progress Notes (Signed)
Subjective:     Kathy Garcia is a 54 y.o. female presenting for Establish Care (uncontrollable bowels ), Abdominal Pain (Right lower ), Nausea, and Hypertension     HPI   #Abdominal pain - nausea impacts her eating  - is eating roast beef sandwich from [employer] as this sounds good - has been losing weight due to poor eating - RLQ  - went to the ER in September due to nausea/diarrhea/vomiting - has not had a formed stool since 2015 - abdominal pain comes an goes since 04/03/2020 - pain 2 times per week and improves with bowel movement - BM 3-6 times per day - nausea - every day, first thing in the AM - improves with zofran, also improves with BM - vomiting - with coughing or talking   #HTN - has been noticing HTN at home - works at nursing home - moving to [employer] - has been checking at work and it has elevated over the last 2 weeks  Review of Systems   Social History   Tobacco Use  Smoking Status Current Every Day Smoker  . Packs/day: 0.50  . Years: 20.00  . Pack years: 10.00  Smokeless Tobacco Never Used  Tobacco Comment   1 ppd x 20 years, quit for 10 years, restarted 2018        Objective:    BP Readings from Last 3 Encounters:  06/13/20 120/68  02/01/19 (!) 154/82  09/13/17 120/80   Wt Readings from Last 3 Encounters:  06/13/20 186 lb 8 oz (84.6 kg)  02/01/19 190 lb (86.2 kg)  09/13/17 190 lb (86.2 kg)    BP 120/68   Pulse 71   Temp (!) 97.4 F (36.3 C) (Temporal)   Ht 5' 3.5" (1.613 m)   Wt 186 lb 8 oz (84.6 kg)   LMP 12/01/2012 (Exact Date)   SpO2 97%   BMI 32.52 kg/m    Physical Exam Constitutional:      General: She is not in acute distress.    Appearance: She is well-developed. She is not diaphoretic.  HENT:     Right Ear: External ear normal.     Left Ear: External ear normal.     Nose: Nose normal.  Eyes:     Conjunctiva/sclera: Conjunctivae normal.  Cardiovascular:     Rate and Rhythm: Normal rate and regular rhythm.      Heart sounds: No murmur heard.   Pulmonary:     Effort: Pulmonary effort is normal.     Breath sounds: No decreased air movement. Decreased breath sounds present. No wheezing.  Abdominal:     General: Bowel sounds are normal.     Palpations: Abdomen is soft.     Tenderness: There is abdominal tenderness in the right lower quadrant. There is no guarding or rebound.  Musculoskeletal:     Cervical back: Neck supple.  Skin:    General: Skin is warm and dry.     Capillary Refill: Capillary refill takes less than 2 seconds.  Neurological:     Mental Status: She is alert. Mental status is at baseline.  Psychiatric:        Mood and Affect: Mood normal.        Behavior: Behavior normal.     GAD 7 : Generalized Anxiety Score 06/13/2020  Nervous, Anxious, on Edge 2  Control/stop worrying 3  Worry too much - different things 2  Trouble relaxing 2  Restless 1  Easily annoyed or irritable  3  Afraid - awful might happen 1  Total GAD 7 Score 14  Anxiety Difficulty Very difficult    Depression screen PHQ 2/9 06/13/2020  Decreased Interest 2  Down, Depressed, Hopeless 2  PHQ - 2 Score 4  Altered sleeping 3  Tired, decreased energy 3  Change in appetite 3  Feeling bad or failure about yourself  2  Trouble concentrating 3  Moving slowly or fidgety/restless 1  Suicidal thoughts 0  PHQ-9 Score 19  Difficult doing work/chores Somewhat difficult         Assessment & Plan:   Problem List Items Addressed This Visit      Cardiovascular and Mediastinum   Essential hypertension    BP normal today, but notes elevated at home and does have prior elevated numbers. She will defer bloodwork due to cost and check in 3 months w/ f/u visit at that time to recheck.       Migraines     Respiratory   COPD (chronic obstructive pulmonary disease) (HCC)    Rare breathing events. Interested in quitting smoking. Vomiting with coughing fits - advised trying albuterol prn to see if that  improves/reduces symptoms      Relevant Medications   albuterol (VENTOLIN HFA) 108 (90 Base) MCG/ACT inhaler     Digestive   Chronic diarrhea - Primary    Notes diarrhea x 5+ years since gallbladder removal. Discussed getting work-up to r/u celiac disease and Inflammatory bowel disease. At this point lower suspicion for infection given duration of symptoms. And will evaluate for thyroid and liver disease. Due to cost will postpone evaluation for 3 months. GI referral of no cause determined.         Other   Generalized anxiety disorder    Previously on xanax but concerns over teenagers possibly taking her pills so stopped. Still with significant anxiety, but due to insurance would like to defer treatment. F/u 3 months      Major depressive disorder    Notes last treated >5 years and cost is a major concern. She is managing OK but will potentially be interested in treatment when she has insurance. Will follow-up in 3 months          Return in about 3 months (around 09/13/2020).  Lynnda Child, MD  This visit occurred during the SARS-CoV-2 public health emergency.  Safety protocols were in place, including screening questions prior to the visit, additional usage of staff PPE, and extensive cleaning of exam room while observing appropriate contact time as indicated for disinfecting solutions.

## 2020-09-10 ENCOUNTER — Ambulatory Visit: Payer: Self-pay | Admitting: Family Medicine

## 2020-09-10 DIAGNOSIS — Z0289 Encounter for other administrative examinations: Secondary | ICD-10-CM

## 2021-01-15 ENCOUNTER — Other Ambulatory Visit: Payer: Self-pay

## 2021-01-15 ENCOUNTER — Ambulatory Visit (INDEPENDENT_AMBULATORY_CARE_PROVIDER_SITE_OTHER): Payer: Self-pay | Admitting: Family Medicine

## 2021-01-15 VITALS — BP 128/70 | HR 74 | Temp 97.9°F | Ht 65.0 in | Wt 166.5 lb

## 2021-01-15 DIAGNOSIS — K529 Noninfective gastroenteritis and colitis, unspecified: Secondary | ICD-10-CM

## 2021-01-15 DIAGNOSIS — G8929 Other chronic pain: Secondary | ICD-10-CM

## 2021-01-15 DIAGNOSIS — Z1159 Encounter for screening for other viral diseases: Secondary | ICD-10-CM

## 2021-01-15 DIAGNOSIS — R109 Unspecified abdominal pain: Secondary | ICD-10-CM

## 2021-01-15 DIAGNOSIS — Z720 Tobacco use: Secondary | ICD-10-CM | POA: Insufficient documentation

## 2021-01-15 DIAGNOSIS — M542 Cervicalgia: Secondary | ICD-10-CM

## 2021-01-15 DIAGNOSIS — Z114 Encounter for screening for human immunodeficiency virus [HIV]: Secondary | ICD-10-CM

## 2021-01-15 DIAGNOSIS — I10 Essential (primary) hypertension: Secondary | ICD-10-CM

## 2021-01-15 DIAGNOSIS — K219 Gastro-esophageal reflux disease without esophagitis: Secondary | ICD-10-CM

## 2021-01-15 DIAGNOSIS — R634 Abnormal weight loss: Secondary | ICD-10-CM | POA: Insufficient documentation

## 2021-01-15 LAB — COMPREHENSIVE METABOLIC PANEL
ALT: 12 U/L (ref 0–35)
AST: 14 U/L (ref 0–37)
Albumin: 4.2 g/dL (ref 3.5–5.2)
Alkaline Phosphatase: 67 U/L (ref 39–117)
BUN: 23 mg/dL (ref 6–23)
CO2: 30 mEq/L (ref 19–32)
Calcium: 8.6 mg/dL (ref 8.4–10.5)
Chloride: 104 mEq/L (ref 96–112)
Creatinine, Ser: 0.82 mg/dL (ref 0.40–1.20)
GFR: 79.63 mL/min (ref >60.00)
Glucose, Bld: 89 mg/dL (ref 70–99)
Potassium: 3.8 mEq/L (ref 3.5–5.1)
Sodium: 140 mEq/L (ref 135–145)
Total Bilirubin: 0.4 mg/dL (ref 0.2–1.2)
Total Protein: 6.7 g/dL (ref 6.0–8.3)

## 2021-01-15 LAB — CBC WITH DIFFERENTIAL/PLATELET
Basophils Absolute: 0 10*3/uL (ref 0.0–0.1)
Basophils Relative: 0.5 % (ref 0.0–3.0)
Eosinophils Absolute: 0.1 10*3/uL (ref 0.0–0.7)
Eosinophils Relative: 2.3 % (ref 0.0–5.0)
HCT: 35.2 % — ABNORMAL LOW (ref 36.0–46.0)
Hemoglobin: 12 g/dL (ref 12.0–15.0)
Lymphocytes Relative: 27 % (ref 12.0–46.0)
Lymphs Abs: 1.8 10*3/uL (ref 0.7–4.0)
MCHC: 34 g/dL (ref 30.0–36.0)
MCV: 98.6 fl (ref 78.0–100.0)
Monocytes Absolute: 0.3 10*3/uL (ref 0.1–1.0)
Monocytes Relative: 4.8 % (ref 3.0–12.0)
Neutro Abs: 4.3 10*3/uL (ref 1.4–7.7)
Neutrophils Relative %: 65.4 % (ref 43.0–77.0)
Platelets: 209 10*3/uL (ref 150.0–400.0)
RBC: 3.57 Mil/uL — ABNORMAL LOW (ref 3.87–5.11)
RDW: 12.8 % (ref 11.5–15.5)
WBC: 6.5 10*3/uL (ref 4.0–10.5)

## 2021-01-15 LAB — HIGH SENSITIVITY CRP: CRP, High Sensitivity: 3.51 mg/L (ref 0.000–5.000)

## 2021-01-15 LAB — LIPID PANEL
Cholesterol: 138 mg/dL (ref 0–200)
HDL: 30.1 mg/dL — ABNORMAL LOW (ref >39.00)
LDL Cholesterol: 91 mg/dL (ref 0–99)
NonHDL: 108.3
Total CHOL/HDL Ratio: 5
Triglycerides: 85 mg/dL (ref 0.0–149.0)
VLDL: 17 mg/dL (ref 0.0–40.0)

## 2021-01-15 NOTE — Assessment & Plan Note (Signed)
Did not have time for full exam today and challenging historian. Discussed starting with PT and if not improving anticipate returning for exam and imaging to further evaluate - reports prior nerve study but then also not sure what was done (>5 years ago)

## 2021-01-15 NOTE — Assessment & Plan Note (Signed)
10+ months of severe diarrhea. 5-10 BM daily and regular use of imodium. Occasional blood in stool. Discussed work-up for celiac, inflammatory bowel disease, and infectious. Red flags include 20lb weight loss will refer to GI for colon cancer screening and evaluation.

## 2021-01-15 NOTE — Progress Notes (Signed)
Subjective:     Kathy Garcia is a 55 y.o. female presenting for Abdominal Pain     HPI  #Diarrhea - with abdominal pain - started august 2021 - ER 04/2020 - losing weight - will take iron and imodium  - poorly formed stool and constant - >10 BM daily - taking iron daily - with 3-4 bm daily - loose stool - occasional blood in the stool - nausea w/ vomiting - vomiting 3-4 times a week - weight down 20 lbs - notes lower abdominal pain - no heartburn or reflux - endorses joint pain - knees, elbow, hip pain  Has been smoking more Not eating as much  #Neck pain - chronic - several years - also with whole hand numbness - though improving today - thinks she had a nerve study but also not sure what was done in 2015/2014 - did not like that provider or work-up  Review of Systems   Social History   Tobacco Use  Smoking Status Every Day   Packs/day: 0.50   Years: 20.00   Pack years: 10.00   Types: Cigarettes  Smokeless Tobacco Never  Tobacco Comments   1 ppd x 20 years, quit for 10 years, restarted 2018        Objective:    BP Readings from Last 3 Encounters:  01/15/21 128/70  06/13/20 120/68  02/01/19 (!) 154/82   Wt Readings from Last 3 Encounters:  01/15/21 166 lb 8 oz (75.5 kg)  06/13/20 186 lb 8 oz (84.6 kg)  02/01/19 190 lb (86.2 kg)    BP 128/70   Pulse 74   Temp 97.9 F (36.6 C) (Temporal)   Ht 5\' 5"  (1.651 m)   Wt 166 lb 8 oz (75.5 kg)   LMP 12/01/2012 (Exact Date)   SpO2 94%   BMI 27.71 kg/m    Physical Exam Constitutional:      General: She is not in acute distress.    Appearance: She is well-developed. She is not diaphoretic.  HENT:     Head: Normocephalic and atraumatic.     Right Ear: External ear normal.     Left Ear: External ear normal.     Nose: Nose normal.  Eyes:     Conjunctiva/sclera: Conjunctivae normal.     Pupils: Pupils are equal, round, and reactive to light.  Cardiovascular:     Rate and Rhythm: Normal  rate and regular rhythm.     Heart sounds: No murmur heard. Pulmonary:     Effort: Pulmonary effort is normal. No respiratory distress.     Breath sounds: Normal breath sounds. No wheezing.  Abdominal:     General: Abdomen is flat. Bowel sounds are normal. There is no distension.     Palpations: Abdomen is soft.     Tenderness: There is generalized abdominal tenderness. There is no guarding or rebound.  Musculoskeletal:     Cervical back: Neck supple.  Skin:    General: Skin is warm and dry.     Capillary Refill: Capillary refill takes less than 2 seconds.  Neurological:     Mental Status: She is alert. Mental status is at baseline.  Psychiatric:        Mood and Affect: Mood normal.        Behavior: Behavior normal.          Assessment & Plan:   Problem List Items Addressed This Visit       Cardiovascular and Mediastinum   Essential  hypertension    BP controlled. No medication currenlty       Relevant Orders   Lipid panel     Digestive   GERD (gastroesophageal reflux disease)    Pt denies heartburn/reflux but has increased smoking and notes chronic nausea with occasional vomiting. Trial of omeprazole to see if symptoms improve.        Chronic diarrhea - Primary    10+ months of severe diarrhea. 5-10 BM daily and regular use of imodium. Occasional blood in stool. Discussed work-up for celiac, inflammatory bowel disease, and infectious. Red flags include 20lb weight loss will refer to GI for colon cancer screening and evaluation.        Relevant Orders   Celiac Pnl 2 rflx Endomysial Ab Ttr   Comprehensive metabolic panel   CBC with Differential   High sensitivity CRP   Ambulatory referral to Gastroenterology   Gastrointestinal Pathogen Panel PCR   Giardia/cryptosporidium (EIA)   CALPROTECTIN     Other   Chronic neck pain    Did not have time for full exam today and challenging historian. Discussed starting with PT and if not improving anticipate returning  for exam and imaging to further evaluate - reports prior nerve study but then also not sure what was done (>5 years ago)       Relevant Orders   Ambulatory referral to Physical Therapy   Tobacco abuse    Pt with increased to 1ppd again. With weight loss will anticipate plan for lung cancer screening once GI work-up is underway.        Weight loss    20 lbs. Likely 2/2 to GI losses and poor PO intake, however, she is not up to date on any cancer screening and is a smoker. Number for mammogram, referral to GI. Anticipate lung cancer screening referral and will have her return for pap        Other Visit Diagnoses     Chronic abdominal pain       Relevant Orders   Celiac Pnl 2 rflx Endomysial Ab Ttr   Comprehensive metabolic panel   CBC with Differential   High sensitivity CRP   Ambulatory referral to Gastroenterology   Gastrointestinal Pathogen Panel PCR   Giardia/cryptosporidium (EIA)   CALPROTECTIN   Need for hepatitis C screening test       Relevant Orders   Hepatitis C antibody   Encounter for screening for HIV       Relevant Orders   HIV Antibody (routine testing w rflx)        Return in about 6 weeks (around 02/26/2021) for neck pain and stomach issues.  Lynnda Child, MD  This visit occurred during the SARS-CoV-2 public health emergency.  Safety protocols were in place, including screening questions prior to the visit, additional usage of staff PPE, and extensive cleaning of exam room while observing appropriate contact time as indicated for disinfecting solutions.

## 2021-01-15 NOTE — Assessment & Plan Note (Signed)
BP controlled. No medication currenlty

## 2021-01-15 NOTE — Assessment & Plan Note (Signed)
20 lbs. Likely 2/2 to GI losses and poor PO intake, however, she is not up to date on any cancer screening and is a smoker. Number for mammogram, referral to GI. Anticipate lung cancer screening referral and will have her return for pap

## 2021-01-15 NOTE — Assessment & Plan Note (Signed)
Pt with increased to 1ppd again. With weight loss will anticipate plan for lung cancer screening once GI work-up is underway.

## 2021-01-15 NOTE — Patient Instructions (Addendum)
Diarrhea - will get work-up to rule out infectious and evaluate for inflammatory causes   Nausea - try Omeprazole 20 mg daily for 2 weeks  Neck pain - referral to physical therapy - return in 6 weeks to check in   #Referral I have placed a referral to a specialist for you. You should receive a phone call from the specialty office. Make sure your voicemail is not full and that if you are able to answer your phone to unknown or new numbers.   It may take up to 2 weeks to hear about the referral. If you do not hear anything in 2 weeks, please call our office and ask to speak with the referral coordinator.   Please call the location of your choice from the menu below to schedule your Mammogram and/or Bone Density appointment.      [facility]   Phone:  [phone]   [address]                                                                            [place], [place] [address]                                            Services: 3D Mammogram and Bone Density

## 2021-01-15 NOTE — Assessment & Plan Note (Signed)
Pt denies heartburn/reflux but has increased smoking and notes chronic nausea with occasional vomiting. Trial of omeprazole to see if symptoms improve.

## 2021-01-17 ENCOUNTER — Other Ambulatory Visit (INDEPENDENT_AMBULATORY_CARE_PROVIDER_SITE_OTHER): Payer: Self-pay

## 2021-01-17 DIAGNOSIS — R109 Unspecified abdominal pain: Secondary | ICD-10-CM

## 2021-01-17 DIAGNOSIS — K529 Noninfective gastroenteritis and colitis, unspecified: Secondary | ICD-10-CM

## 2021-01-17 DIAGNOSIS — G8929 Other chronic pain: Secondary | ICD-10-CM

## 2021-01-21 LAB — CELIAC PNL 2 RFLX ENDOMYSIAL AB TTR
(tTG) Ab, IgA: <1 U/mL
(tTG) Ab, IgG: <1 U/mL
Endomysial Ab IgA: NEGATIVE
Gliadin IgA: 2 U/mL
Gliadin IgG: <1 U/mL
Immunoglobulin A: 265 mg/dL (ref 47–310)

## 2021-01-21 LAB — HIV ANTIBODY (ROUTINE TESTING W REFLEX): HIV 1&2 Ab, 4th Generation: NONREACTIVE

## 2021-01-21 LAB — HEPATITIS C ANTIBODY
Hepatitis C Ab: NONREACTIVE
SIGNAL TO CUT-OFF: 0 (ref <1.00)

## 2021-01-23 LAB — GIARDIA AND CRYPTOSPORIDIUM ANTIGEN PANEL
MICRO NUMBER:: 12022265
RESULT:: NOT DETECTED
SPECIMEN QUALITY:: ADEQUATE
Specimen Quality:: ADEQUATE
micro Number:: 12022264

## 2021-01-23 LAB — CALPROTECTIN: Calprotectin: 156 mcg/g — ABNORMAL HIGH

## 2021-01-24 LAB — GASTROINTESTINAL PATHOGEN PANEL PCR

## 2021-01-24 NOTE — Progress Notes (Signed)
error 

## 2021-01-27 ENCOUNTER — Telehealth: Payer: Self-pay

## 2021-01-27 NOTE — Telephone Encounter (Signed)
-----   Message from Wyline Mood, MD sent at 01/27/2021  8:18 AM EDT ----- Regarding: RE: Upcoming referral Clell Trahan can you check on the patient please for an appointment    Kiran  ----- Message ----- From: Lynnda Child, MD Sent: 01/23/2021  11:31 AM EDT To: Wyline Mood, MD Subject: Upcoming referral                              Dr. Tobi Bastos,   This patient's Calprotectin just returned elevated. It looks like her office tried to reach her but she didn't call back. Hopefully he will call to schedule but I wanted to let you know ahead of time to see if she is someone who should be seen sooner.   Appreciate your care.   Shanda Bumps

## 2021-01-28 ENCOUNTER — Emergency Department
Admission: EM | Admit: 2021-01-28 | Discharge: 2021-01-28 | Disposition: A | Discharge status: Home / Self Care | Payer: PRIVATE HEALTH INSURANCE | Attending: Emergency Medicine | Admitting: Emergency Medicine

## 2021-01-28 ENCOUNTER — Emergency Department: Payer: PRIVATE HEALTH INSURANCE

## 2021-01-28 ENCOUNTER — Other Ambulatory Visit: Payer: Self-pay

## 2021-01-28 ENCOUNTER — Encounter: Payer: Self-pay | Admitting: Medical Oncology

## 2021-01-28 DIAGNOSIS — R197 Diarrhea, unspecified: Secondary | ICD-10-CM | POA: Insufficient documentation

## 2021-01-28 DIAGNOSIS — F17210 Nicotine dependence, cigarettes, uncomplicated: Secondary | ICD-10-CM | POA: Insufficient documentation

## 2021-01-28 DIAGNOSIS — R112 Nausea with vomiting, unspecified: Secondary | ICD-10-CM | POA: Insufficient documentation

## 2021-01-28 DIAGNOSIS — I10 Essential (primary) hypertension: Secondary | ICD-10-CM | POA: Diagnosis not present

## 2021-01-28 DIAGNOSIS — R1031 Right lower quadrant pain: Secondary | ICD-10-CM | POA: Insufficient documentation

## 2021-01-28 DIAGNOSIS — G8929 Other chronic pain: Secondary | ICD-10-CM | POA: Diagnosis not present

## 2021-01-28 DIAGNOSIS — J449 Chronic obstructive pulmonary disease, unspecified: Secondary | ICD-10-CM | POA: Diagnosis not present

## 2021-01-28 DIAGNOSIS — K219 Gastro-esophageal reflux disease without esophagitis: Secondary | ICD-10-CM | POA: Diagnosis not present

## 2021-01-28 LAB — URINALYSIS, COMPLETE (UACMP) WITH MICROSCOPIC
Bilirubin Urine: NEGATIVE
Glucose, UA: NEGATIVE mg/dL
Hgb urine dipstick: NEGATIVE
Ketones, ur: NEGATIVE mg/dL
Leukocytes,Ua: NEGATIVE
Nitrite: NEGATIVE
Protein, ur: NEGATIVE mg/dL
Specific Gravity, Urine: 1.023 (ref 1.005–1.030)
pH: 5 (ref 5.0–8.0)

## 2021-01-28 LAB — C DIFFICILE QUICK SCREEN W PCR REFLEX
C Diff antigen: POSITIVE — AB
C Diff toxin: NEGATIVE

## 2021-01-28 LAB — GASTROINTESTINAL PANEL BY PCR, STOOL (REPLACES STOOL CULTURE)

## 2021-01-28 LAB — COMPREHENSIVE METABOLIC PANEL
ALT: 15 U/L (ref 0–44)
AST: 16 U/L (ref 15–41)
Albumin: 4.6 g/dL (ref 3.5–5.0)
Alkaline Phosphatase: 75 U/L (ref 38–126)
Anion gap: 7 (ref 5–15)
BUN: 25 mg/dL — ABNORMAL HIGH (ref 6–20)
CO2: 29 mmol/L (ref 22–32)
Calcium: 9.6 mg/dL (ref 8.9–10.3)
Chloride: 104 mmol/L (ref 98–111)
Creatinine, Ser: 0.79 mg/dL (ref 0.44–1.00)
GFR, Estimated: >60 mL/min (ref >60)
Glucose, Bld: 104 mg/dL — ABNORMAL HIGH (ref 70–99)
Potassium: 3.9 mmol/L (ref 3.5–5.1)
Sodium: 140 mmol/L (ref 135–145)
Total Bilirubin: 0.9 mg/dL (ref 0.3–1.2)
Total Protein: 7.9 g/dL (ref 6.5–8.1)

## 2021-01-28 LAB — CBC
HCT: 40.6 % (ref 36.0–46.0)
Hemoglobin: 13.8 g/dL (ref 12.0–15.0)
MCH: 34.1 pg — ABNORMAL HIGH (ref 26.0–34.0)
MCHC: 34 g/dL (ref 30.0–36.0)
MCV: 100.2 fL — ABNORMAL HIGH (ref 80.0–100.0)
Platelets: 202 10*3/uL (ref 150–400)
RBC: 4.05 MIL/uL (ref 3.87–5.11)
RDW: 11.9 % (ref 11.5–15.5)
WBC: 8.9 10*3/uL (ref 4.0–10.5)
nRBC: 0 % (ref 0.0–0.2)

## 2021-01-28 LAB — LIPASE, BLOOD: Lipase: 27 U/L (ref 11–51)

## 2021-01-28 LAB — CLOSTRIDIUM DIFFICILE BY PCR, REFLEXED: Toxigenic C. Difficile by PCR: NEGATIVE

## 2021-01-28 MED ORDER — DICYCLOMINE HCL 10 MG PO CAPS: 10.0000 mg | ORAL_CAPSULE | Freq: Four times a day (QID) | ORAL | 0 refills | Status: DC

## 2021-01-28 MED ORDER — ONDANSETRON HCL 4 MG/2ML IJ SOLN
4.0000 mg | Freq: Once | INTRAMUSCULAR | Status: AC
  Administered 2021-01-28: 4 mg via INTRAVENOUS
  Filled 2021-01-28: qty 2

## 2021-01-28 MED ORDER — SODIUM CHLORIDE 0.9 % IV BOLUS
1000.0000 mL | Freq: Once | INTRAVENOUS | Status: AC
  Administered 2021-01-28: 1000 mL via INTRAVENOUS

## 2021-01-28 MED ORDER — ONDANSETRON 4 MG PO TBDP: 4.0000 mg | ORAL_TABLET | Freq: Three times a day (TID) | ORAL | 0 refills | Status: DC | PRN

## 2021-01-28 MED ORDER — IOHEXOL 300 MG/ML  SOLN
100.0000 mL | Freq: Once | INTRAMUSCULAR | Status: AC | PRN
  Administered 2021-01-28: 100 mL via INTRAVENOUS
  Filled 2021-01-28: qty 100

## 2021-01-28 NOTE — Telephone Encounter (Signed)
FYI: Dr. Tobi Bastos, I had called patient yesterday to schedule her an appointment and she did not pick up. I was not able to leave her a voicemail either. I then wanted to call her today and see that she is currently at the ED but not sure for what.

## 2021-01-28 NOTE — Discharge Instructions (Addendum)
Your CT is as below- You need to discuss your primary doctor to get testing to work up the adrenal nodule although it does appear stable in size.  For the abdominal pain/diarrhea- call GI and say u need a sooner GI follow up. Otherwise holding off on rx for c diff given does not appear to be active infection.  Going to try bentyl which helps with irritable bowel.    Extensive colonic diverticulosis, most prominent in the sigmoid colon. No evidence of acute diverticulitis.  No evidence of bowel obstruction, appendicitis, or other acute abnormality in the abdomen or pelvis.  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

## 2021-01-28 NOTE — ED Provider Notes (Signed)
[facility] Emergency Department Provider Note  ____________________________________________   Event Date/Time   First MD Initiated Contact with Patient 01/28/21 1139     (approximate)  I have reviewed the triage vital signs and the nursing notes.   HISTORY  Chief Complaint Constipation and Abdominal Pain    HPI Kathy Garcia is a 55 y.o. female with COPD, depression who comes in for abdominal pain.  Per triage reports over a year and a half that she is having issues with constipation.  For the past 3 days she has been having lower abdominal pain with nausea and vomiting.  Patient states that she is not having constipation contrary to triage note.  She states that is the opposite.  She states that she is having diarrhea.  This is intermittent, multiple times a day, nothing makes it better or worse.  She does report being on amoxicillin in March for tooth extraction. Does not say it got worse after this. Just reports she has had diarrhea for over year. She states that the abdominal pain is now getting worse in the lower abdomen more on the right side and now having worsening of associated nausea and vomiting.  She does have Zofran which helps.  She states that it is impossible for her to work due to these symptoms.    On review of records patient was seen on 6/15 where she reported chronic diarrhea, 5-10 daily bowel movements and regular use of Imodium.  Patient was referred to GI as well as scheduled for her mammogram, lung cancer screening referral and Pap.  She had an elevated calprotectin           Past Medical History:  Diagnosis Date   Allergy    Anxiety    COPD (chronic obstructive pulmonary disease) (HCC)    Depression    Gallstones and inflammation of gallbladder without obstruction 01/03/2013   GERD (gastroesophageal reflux disease)     Patient Active Problem List   Diagnosis Date Noted   Chronic neck pain 01/15/2021   Tobacco abuse  01/15/2021   Weight loss 01/15/2021   Generalized anxiety disorder 06/13/2020   COPD (chronic obstructive pulmonary disease) (HCC) 06/13/2020   Essential hypertension 06/13/2020   Migraines 06/13/2020   Major depressive disorder 06/13/2020   GERD (gastroesophageal reflux disease) 01/03/2014   Chronic diarrhea 01/03/2014   Abdominal pain, right upper quadrant 01/28/2013    Past Surgical History:  Procedure Laterality Date   CHOLECYSTECTOMY  2014   TUBAL LIGATION      Prior to Admission medications   Medication Sig Start Date End Date Taking? Authorizing Provider  albuterol (VENTOLIN HFA) 108 (90 Base) MCG/ACT inhaler Inhale 2 puffs into the lungs every 4 (four) hours as needed for wheezing. 06/13/20   Lynnda Child, MD  ondansetron (ZOFRAN-ODT) 4 MG disintegrating tablet Take 4 mg by mouth every 8 (eight) hours as needed.  Patient not taking: Reported on 01/15/2021 04/04/20   [provider]    Allergies Hydrocodone and Penicillin g  Family History  Problem Relation Age of Onset   Hypertension Mother    Alzheimer's disease Mother    Diverticulitis Mother    Cancer Father        unknown type   Colon cancer Maternal Aunt    Heart failure Maternal Uncle    Stomach cancer Paternal Aunt    Breast cancer Maternal Aunt    Brain cancer Paternal Aunt    Breast cancer Paternal Aunt  Social History Social History   Tobacco Use   Smoking status: Every Day    Packs/day: 0.50    Years: 20.00    Pack years: 10.00    Types: Cigarettes   Smokeless tobacco: Never   Tobacco comments:    1 ppd x 20 years, quit for 10 years, restarted 2018  Vaping Use   Vaping Use: Never used  Substance Use Topics   Alcohol use: Not Currently    Comment: none since 2018   Drug use: No      Review of Systems Constitutional: No fever/chills Eyes: No visual changes. ENT: No sore throat. Cardiovascular: Denies chest pain. Respiratory: Denies shortness of  breath. Gastrointestinal: Abdominal pain, nausea, vomiting, diarrhea Genitourinary: Negative for dysuria. Musculoskeletal: Negative for back pain. Skin: Negative for rash. Neurological: Negative for headaches, focal weakness or numbness. All other ROS negative ____________________________________________   PHYSICAL EXAM:  VITAL SIGNS: ED Triage Vitals  Enc Vitals Group     BP 01/28/21 1029 (!) 133/95     Pulse Rate 01/28/21 1029 90     Resp 01/28/21 1029 18     Temp 01/28/21 1029 98.7 F (37.1 C)     Temp Source 01/28/21 1029 Oral     SpO2 01/28/21 1029 100 %     Weight 01/28/21 1030 165 lb 5.5 oz (75 kg)     Height 01/28/21 1030 5\' 5"  (1.651 m)     Head Circumference --      Peak Flow --      Pain Score 01/28/21 1030 8     Pain Loc --      Pain Edu? --      Excl. in GC? --     Constitutional: Alert and oriented. Well appearing and in no acute distress. Eyes: Conjunctivae are normal. EOMI. Head: Atraumatic. Nose: No congestion/rhinnorhea. Mouth/Throat: Mucous membranes are moist.   Neck: No stridor. Trachea Midline. FROM Cardiovascular: Normal rate, regular rhythm. Grossly normal heart sounds.  Good peripheral circulation. Respiratory: Normal respiratory effort.  No retractions. Lungs CTAB. Gastrointestinal: Tender in the right lower quadrant without any rebound or guarding.. No distention. No abdominal bruits.  Musculoskeletal: No lower extremity tenderness nor edema.  No joint effusions. Neurologic:  Normal speech and language. No gross focal neurologic deficits are appreciated.  Skin:  Skin is warm, dry and intact. No rash noted. Psychiatric: Mood and affect are normal. Speech and behavior are normal. GU: Deferred   ____________________________________________   LABS (all labs ordered are listed, but only abnormal results are displayed)  Labs Reviewed  COMPREHENSIVE METABOLIC PANEL - Abnormal; Notable for the following components:      Result Value   Glucose,  Bld 104 (*)    BUN 25 (*)    All other components within normal limits  CBC - Abnormal; Notable for the following components:   MCV 100.2 (*)    MCH 34.1 (*)    All other components within normal limits  URINALYSIS, COMPLETE (UACMP) WITH MICROSCOPIC - Abnormal; Notable for the following components:   Color, Urine YELLOW (*)    APPearance CLEAR (*)    All other components within normal limits  LIPASE, BLOOD   ____________________________________________    RADIOLOGY   Official radiology report(s): CT ABDOMEN PELVIS W CONTRAST  Result Date: 01/28/2021 CLINICAL DATA:  Abdominal distension, lower abdominal painfor 3 days with intermittent nausea and vomiting EXAM: CT ABDOMEN AND PELVIS WITH CONTRAST TECHNIQUE: Multidetector CT imaging of the abdomen and pelvis was performed using  the standard protocol following bolus administration of intravenous contrast. CONTRAST:  100mL OMNIPAQUE IOHEXOL 300 MG/ML  SOLN COMPARISON:  CT abdomen pelvis 02/01/2019 FINDINGS: Lower chest: No acute abnormality. Hepatobiliary: No focal liver abnormality is seen. Prior cholecystectomy. Pancreas: Unremarkable. No pancreatic ductal dilatation or surrounding inflammatory changes. Spleen: Normal in size without focal abnormality. Adrenals/Urinary Tract: Unchanged 1.8 cm left adrenal nodule which measures 74 Hounsfield units. The right adrenal gland is normal. No hydronephrosis. Unchanged right upper pole renal cyst. The bladder is minimally distended. Stomach/Bowel: There is a small hiatal hernia. The stomach is within normal limits. There is no evidence of bowel obstruction. The appendix is normal. There is extensive colonic diverticulosis, worst in the sigmoid colon. Vascular/Lymphatic: Aorta bi-iliac atherosclerotic calcifications. No AAA. There is no lymphadenopathy. Reproductive: Unremarkable. Other: No abdominal wall hernia or abnormality. No abdominopelvic ascites. Musculoskeletal: No acute or significant osseous  findings. IMPRESSION: Extensive colonic diverticulosis, most prominent in the sigmoid colon. No evidence of acute diverticulitis. No evidence of bowel obstruction, appendicitis, or other acute abnormality in the abdomen or pelvis. 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 Electronically Signed   By: Caprice RenshawJacob  Kahn   On: 01/28/2021 15:29    ____________________________________________   PROCEDURES  Procedure(s) performed (including Critical Care):  Procedures   ____________________________________________   INITIAL IMPRESSION / ASSESSMENT AND PLAN / ED COURSE  Kathy Garcia was evaluated in Emergency Department on 01/28/2021 for the symptoms described in the history of present illness. She was evaluated in the context of the global COVID-19 pandemic, which necessitated consideration that the patient might be at risk for infection with the SARS-CoV-2 virus that causes COVID-19. Institutional protocols and algorithms that pertain to the evaluation of patients at risk for COVID-19 are in a state of rapid change based on information released by regulatory bodies including the CDC and federal and state organizations. These policies and algorithms were followed during the patient's care in the ED.    Patient is a 55 year old who comes in with lower abdominal pain, nausea, vomiting, diarrhea.  Labs ordered to evaluate for Electra normalities, AKI.  Will get CT scan to evaluate for appendicitis, obstruction, cancer due to weight loss.  Given frequent stooling will send stool cultures and C. difficile culture    5:34 PM labs are reassuring.  CT imaging shows stable adrenal nodule but recommended a biochemical profile.  I did provide copy of this results and discussed this with patient.  Potentially may be with her but this could be causing some of the diarrhea and abdominal pain.  I also discussed with her that her C.  difficile test was positive although toxin and PCR was negative.  I also did discuss with the ID doctor Ravishankar who stated that this most likely did not represent an active infection. Given her abdominal pain and diarrhea seems to be more chronic in nature and the fact that the toxin and PCR is negative I do not feel like we need to do treatment at this time.  Again discussed with patient that she needs to follow-up with GI as soon as possible.  I did give them the number to recall them to see if they can get her in earlier.  We can trial some Bentyl and Zofran until then.  Patient expressed understanding felt comfortable with this.           ____________________________________________   FINAL CLINICAL IMPRESSION(S) / ED DIAGNOSES  Final diagnoses:  Chronic abdominal pain  Diarrhea, unspecified type      MEDICATIONS GIVEN DURING THIS VISIT:  Medications  ondansetron (ZOFRAN) injection 4 mg (4 mg Intravenous Given 01/28/21 1238)  sodium chloride 0.9 % bolus 1,000 mL (1,000 mLs Intravenous New Bag/Given 01/28/21 1238)  iohexol (OMNIPAQUE) 300 MG/ML solution 100 mL (100 mLs Intravenous Contrast Given 01/28/21 1318)     ED Discharge Orders          Ordered    dicyclomine (BENTYL) 10 MG capsule  4 times daily        01/28/21 1734    ondansetron (ZOFRAN ODT) 4 MG disintegrating tablet  Every 8 hours PRN        01/28/21 1735             Note:  This document was prepared using Dragon voice recognition software and may include unintentional dictation errors.    Concha Se, MD 01/28/21 [phone]

## 2021-01-28 NOTE — ED Notes (Signed)
Discharge instructions reviewed with pt and spouse.

## 2021-01-28 NOTE — ED Triage Notes (Signed)
Pt reports that for over a year and a half that she has been having issues with constipation, states that for the past 3 days she has been having lower abd pain with nausea and vomiting. Pt reports that the vomiting has been intermittent since September.

## 2021-01-30 ENCOUNTER — Telehealth: Payer: Self-pay

## 2021-01-30 ENCOUNTER — Telehealth: Payer: Self-pay | Admitting: Family Medicine

## 2021-01-30 NOTE — Telephone Encounter (Signed)
Patient has a new patient appointment on 03/26/2021/ She states she had to go to the ER on Tuesday because her symptoms are getting worse. She would like to be sooner then 03/26/21

## 2021-01-31 ENCOUNTER — Telehealth: Payer: Self-pay | Admitting: Family Medicine

## 2021-01-31 ENCOUNTER — Other Ambulatory Visit: Payer: Self-pay

## 2021-01-31 MED ORDER — ONDANSETRON 4 MG PO TBDP: 4.0000 mg | ORAL_TABLET | Freq: Three times a day (TID) | ORAL | 0 refills | Status: DC | PRN

## 2021-01-31 NOTE — Telephone Encounter (Signed)
Refill sent to [facility]. Tried to reach pt but no answer and VM is full.

## 2021-01-31 NOTE — Telephone Encounter (Signed)
Called patient and had to leave her a detailed message letting her know that I am double booking Dr. Tobi Bastos on 02/10/2021 at 1:15 PM so she could be seen sooner. I also told her that I would send her the reminder information via mail since she is not active on MyChart.

## 2021-01-31 NOTE — Telephone Encounter (Signed)
Mrs. Tawanna Solo called in wanted to know if Dr. Selena Batten could write a prescription for Zofran she got it from when she was at the hospital and wanted to know if  due to she lost the medication.

## 2021-02-10 ENCOUNTER — Ambulatory Visit: Payer: No Typology Code available for payment source | Admitting: Gastroenterology

## 2021-02-26 ENCOUNTER — Encounter: Payer: Self-pay | Admitting: Gastroenterology

## 2021-02-26 ENCOUNTER — Ambulatory Visit (INDEPENDENT_AMBULATORY_CARE_PROVIDER_SITE_OTHER): Payer: No Typology Code available for payment source | Admitting: Gastroenterology

## 2021-02-26 ENCOUNTER — Other Ambulatory Visit: Payer: Self-pay

## 2021-02-26 VITALS — BP 118/83 | HR 89 | Temp 99.1°F | Ht 63.5 in | Wt 155.4 lb

## 2021-02-26 DIAGNOSIS — K529 Noninfective gastroenteritis and colitis, unspecified: Secondary | ICD-10-CM

## 2021-02-26 MED ORDER — NA SULFATE-K SULFATE-MG SULF 17.5-3.13-1.6 GM/177ML PO SOLN: 354.0000 mL | Freq: Once | ORAL | 0 refills | Status: AC

## 2021-02-26 MED ORDER — CHOLESTYRAMINE LIGHT 4 G PO PACK: 4.0000 g | PACK | Freq: Two times a day (BID) | ORAL | 4 refills | Status: DC

## 2021-02-26 NOTE — Progress Notes (Signed)
Wyline Mood MD, MRCP(U.K) [address]  [address]  [place], [place] [address]  Main: [phone]  Fax: [phone]   Gastroenterology Consultation  Referring Provider:     Lynnda Child, MD Primary Care Physician:  Lynnda Child, MD Primary Gastroenterologist:  Dr. Wyline Mood  Reason for Consultation:     Diarrhea chronic diarrhea and abdominal pain        HPI:   Kathy Garcia is a 55 y.o. y/o female referred for consultation & management  by Dr. Selena Batten, Chryl Heck, MD.    She has been referred for chronic diarrhea and abdominal pain.  Recently seen in the ER on 01/28/2021 for chronic abdominal pain and diarrhea.  At that point of time responded that she had abdominal pain and issues with constipation for over a year and a half.  Had been on antibiotics in March for a tooth extraction.  01/28/2021 CT scan of the abdomen and pelvis showed extensive colonic diverticulosis no evidence of diverticulitis left adrenal nodule otherwise normal. Undergone laparoscopic cholecystectomy in 2014  01/28/2021 C. difficile testing and GI PCR were negative but the C. difficile quick test was positive for antigen  Although her stool test was positive for the antigen in the setting of diarrhea she was not given any antibiotics at that time fecal calprotectin has been elevated in June 2022 celiac panel in June 2022 was negative  She states that she has had diarrhea since 2014 when she had her gallbladder taken out.  Presently has ongoing diarrhea multiple times a day.  Very watery in nature.  Nonbloody.  She takes Imodium up to 4 times a day along with iron pills to help her with the diarrhea.  Not much artificial sugars in her diet.  Denies any NSAID use.  Denies any prior colonoscopy.  Father had colon polyps.  Has lost over 50 pounds of weight.  Denies any clear abdominal pain but has cramping in the lower part of the abdomen relieved after bowel movement.  Past Medical History:  Diagnosis  Date   Allergy    Anxiety    COPD (chronic obstructive pulmonary disease) (HCC)    Depression    Gallstones and inflammation of gallbladder without obstruction 01/03/2013   GERD (gastroesophageal reflux disease)     Past Surgical History:  Procedure Laterality Date   CHOLECYSTECTOMY  2014   TUBAL LIGATION      Prior to Admission medications   Medication Sig Start Date End Date Taking? Authorizing Provider  albuterol (VENTOLIN HFA) 108 (90 Base) MCG/ACT inhaler Inhale 2 puffs into the lungs every 4 (four) hours as needed for wheezing. 06/13/20   Lynnda Child, MD  dicyclomine (BENTYL) 10 MG capsule Take 1 capsule (10 mg total) by mouth 4 (four) times daily for 14 days. 01/28/21 02/11/21  Concha Se, MD  ondansetron (ZOFRAN ODT) 4 MG disintegrating tablet Take 1 tablet (4 mg total) by mouth every 8 (eight) hours as needed for nausea or vomiting. 01/31/21   Lynnda Child, MD    Family History  Problem Relation Age of Onset   Hypertension Mother    Alzheimer's disease Mother    Diverticulitis Mother    Cancer Father        unknown type   Colon cancer Maternal Aunt    Heart failure Maternal Uncle    Stomach cancer Paternal Aunt    Breast cancer Maternal Aunt    Brain cancer Paternal Aunt  Breast cancer Paternal Aunt      Social History   Tobacco Use   Smoking status: Every Day    Packs/day: 0.50    Years: 20.00    Pack years: 10.00    Types: Cigarettes   Smokeless tobacco: Never   Tobacco comments:    1 ppd x 20 years, quit for 10 years, restarted 2018  Vaping Use   Vaping Use: Never used  Substance Use Topics   Alcohol use: Not Currently    Comment: none since 2018   Drug use: No    Allergies as of 02/26/2021 - Review Complete 01/28/2021  Allergen Reaction Noted   Hydrocodone Nausea And Vomiting 01/03/2013   Penicillin g Other (See Comments) 01/01/2014    Review of Systems:    All systems reviewed and negative except where noted in HPI.   Physical Exam:   LMP 12/01/2012 (Exact Date)  Patient's last menstrual period was 12/01/2012 (exact date). Psych:  Alert and cooperative. Normal mood and affect. General:   Alert,  Well-developed, well-nourished, pleasant and cooperative in NAD Head:  Normocephalic and atraumatic. Eyes:  Sclera clear, no icterus.   Conjunctiva pink. Ears:  Normal auditory acuity. Lungs:  Respirations even and unlabored.  Clear throughout to auscultation.   No wheezes, crackles, or rhonchi. No acute distress. Heart:  Regular rate and rhythm; no murmurs, clicks, rubs, or gallops. Abdomen:  Normal bowel sounds.  No bruits.  Soft, non-tender and non-distended without masses, hepatosplenomegaly or hernias noted.  No guarding or rebound tenderness.    Neurologic:  Alert and oriented x3;  grossly normal neurologically. Psych:  Alert and cooperative. Normal mood and affect.  Imaging Studies: CT ABDOMEN PELVIS W CONTRAST  Result Date: 01/28/2021 CLINICAL DATA:  Abdominal distension, lower abdominal painfor 3 days with intermittent nausea and vomiting EXAM: CT ABDOMEN AND PELVIS WITH CONTRAST TECHNIQUE: Multidetector CT imaging of the abdomen and pelvis was performed using the standard protocol following bolus administration of intravenous contrast. CONTRAST:  OMNIPAQUE IOHEXOL 300 MG/ML  SOLN COMPARISON:  CT abdomen pelvis 02/01/2019 FINDINGS: Lower chest: No acute abnormality. Hepatobiliary: No focal liver abnormality is seen. Prior cholecystectomy. Pancreas: Unremarkable. No pancreatic ductal dilatation or surrounding inflammatory changes. Spleen: Normal in size without focal abnormality. Adrenals/Urinary Tract: Unchanged 1.8 cm left adrenal nodule which measures 74 Hounsfield units. The right adrenal gland is normal. No hydronephrosis. Unchanged right upper pole renal cyst. The bladder is minimally distended. Stomach/Bowel: There is a small hiatal hernia. The stomach is within normal limits. There is no evidence of bowel  obstruction. The appendix is normal. There is extensive colonic diverticulosis, worst in the sigmoid colon. Vascular/Lymphatic: Aorta bi-iliac atherosclerotic calcifications. No AAA. There is no lymphadenopathy. Reproductive: Unremarkable. Other: No abdominal wall hernia or abnormality. No abdominopelvic ascites. Musculoskeletal: No acute or significant osseous findings. IMPRESSION: Extensive colonic diverticulosis, most prominent in the sigmoid colon. No evidence of acute diverticulitis. No evidence of bowel obstruction, appendicitis, or other acute abnormality in the abdomen or pelvis. 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 Electronically Signed   By: Caprice Renshaw   On: 01/28/2021 15:29    Assessment and Plan:   Kathy Garcia is a 55 y.o. y/o female has been referred for chronic abdominal pain and diarrhea.  The diarrhea is chronic and the abdominal pain precedes a bowel movement.  Differentials include bile salt diarrhea postcholecystectomy versus inflammatory bowel disease versus IBS diarrhea  Plan  1.  Recheck stool studies for infection as C. difficile antigen was positive previously.  If positive will treat 2.  Check fecal calprotectin 3.  Stop all artificial sugars and sweeteners 4.  Stop Imodium and iron pills and commence on Questran 1 sachet twice daily can increase to 3 times a day 5.  Schedule for colonoscopy to evaluate for colitis as well as microscopic colitis  I have discussed alternative options, risks & benefits,  which include, but are not limited to, bleeding, infection, perforation,respiratory complication & drug reaction.  The patient agrees with this plan & written consent will be obtained.     Follow up in 4 weeks  Dr Wyline Mood MD,MRCP(U.K)

## 2021-02-26 NOTE — Patient Instructions (Signed)
Please STOP taking Imodium and Iron pills.

## 2021-03-03 ENCOUNTER — Ambulatory Visit (INDEPENDENT_AMBULATORY_CARE_PROVIDER_SITE_OTHER): Payer: No Typology Code available for payment source | Admitting: Family Medicine

## 2021-03-03 ENCOUNTER — Other Ambulatory Visit: Payer: Self-pay

## 2021-03-03 ENCOUNTER — Encounter: Payer: Self-pay | Admitting: Family Medicine

## 2021-03-03 VITALS — BP 132/76 | HR 71 | Temp 97.7°F | Ht 63.5 in | Wt 156.0 lb

## 2021-03-03 DIAGNOSIS — L989 Disorder of the skin and subcutaneous tissue, unspecified: Secondary | ICD-10-CM | POA: Diagnosis not present

## 2021-03-03 DIAGNOSIS — K529 Noninfective gastroenteritis and colitis, unspecified: Secondary | ICD-10-CM | POA: Diagnosis not present

## 2021-03-03 DIAGNOSIS — Z720 Tobacco use: Secondary | ICD-10-CM | POA: Diagnosis not present

## 2021-03-03 NOTE — Assessment & Plan Note (Signed)
Low suspicion for monkey pox given lack of prodrome or exposure. Suspect some pustule lesions - advised warm compress and watch and wait. If persisting/worsening dermatology referral

## 2021-03-03 NOTE — Assessment & Plan Note (Signed)
Pt at high risk for lung cancer. Advised referral to lung cancer screening

## 2021-03-03 NOTE — Progress Notes (Signed)
Subjective:     Kathy Garcia is a 55 y.o. female presenting for monkey pox (Concern that she may have monkey pox)     HPI  #Skin lesions  - concern for monkey pox - no known exposure - partner w/o any symptoms - endorses fever/chills/stomach pain - but this is ongoing an no recent worsening - denies recent flu-like illness - first noticed a red blister on the left hand 8 days ago - now with a blister on the right arm which seems to have burst - also noticed one on the face today - did have a temperature 99.0, 5 days ago  #Stomach pain - on going - did have 1-2 days w/o nausea and the first day she could eat - this was followed by constipation and abdominal distention/pain which is improving today but now with frequent small stools - has seen GI and has colonscopy scheduled 8/18 - no longer taking imodium or iron - started [place]  Review of Systems  01/15/2021: Clinic - chronic diarrhea - work-up, seeing GI. Neck pain - PT referral, poor historian. no time for exam. Tobacco - needs CT scan  Social History   Tobacco Use  Smoking Status Every Day   Packs/day: 0.50   Years: 20.00   Pack years: 10.00   Types: Cigarettes  Smokeless Tobacco Never  Tobacco Comments   1 ppd x 20 years, quit for 10 years, restarted 2018        Objective:    BP Readings from Last 3 Encounters:  03/03/21 132/76  02/26/21 118/83  01/28/21 111/70   Wt Readings from Last 3 Encounters:  03/03/21 156 lb (70.8 kg)  02/26/21 155 lb 6.4 oz (70.5 kg)  01/28/21 165 lb 5.5 oz (75 kg)    BP 132/76   Pulse 71   Temp 97.7 F (36.5 C) (Temporal)   Ht 5' 3.5" (1.613 m)   Wt 156 lb (70.8 kg)   LMP 12/01/2012 (Exact Date)   SpO2 98%   BMI 27.20 kg/m    Physical Exam Constitutional:      General: She is not in acute distress.    Appearance: She is well-developed. She is not diaphoretic.  HENT:     Right Ear: External ear normal.     Left Ear: External ear normal.     Nose:  Nose normal.  Eyes:     Conjunctiva/sclera: Conjunctivae normal.  Cardiovascular:     Rate and Rhythm: Normal rate and regular rhythm.     Heart sounds: No murmur heard. Pulmonary:     Effort: Pulmonary effort is normal. No respiratory distress.     Breath sounds: Normal breath sounds. No wheezing.  Abdominal:     General: Abdomen is flat. Bowel sounds are normal. There is no distension.     Palpations: Abdomen is soft.     Tenderness: There is abdominal tenderness (generalized). There is guarding. There is no rebound.  Musculoskeletal:     Cervical back: Neck supple.  Skin:    General: Skin is warm and dry.     Capillary Refill: Capillary refill takes less than 2 seconds.     Comments: Left hand: small non-painful, non-blanching, red blister Right arm: small pustules with erythema with central white spot Right face near upper lip: small erythematous pustule  Neurological:     Mental Status: She is alert. Mental status is at baseline.  Psychiatric:        Mood and Affect: Mood normal.  Behavior: Behavior normal.          Assessment & Plan:   Problem List Items Addressed This Visit       Digestive   Chronic diarrhea    Encouraged continuing with GI guidance. Discussed ER precautions. Cont bentyl for pain.          Musculoskeletal and Integument   Skin lesions    Low suspicion for monkey pox given lack of prodrome or exposure. Suspect some pustule lesions - advised warm compress and watch and wait. If persisting/worsening dermatology referral         Other   Tobacco abuse - Primary    Pt at high risk for lung cancer. Advised referral to lung cancer screening       Relevant Orders   Ambulatory Referral for Lung Cancer Scre   I spent >25 minutes with pt , obtaining history, examining, reviewing chart, documenting encounter and discussing the above plan of care.   Return if symptoms worsen or fail to improve.  Lynnda Child, MD  This visit occurred  during the SARS-CoV-2 public health emergency.  Safety protocols were in place, including screening questions prior to the visit, additional usage of staff PPE, and extensive cleaning of exam room while observing appropriate contact time as indicated for disinfecting solutions.

## 2021-03-03 NOTE — Patient Instructions (Signed)
Skin lesions - I do not think this is monkey pox - would recommend warm compress - if more lesions or worsening can plan for dermatology referral  Stomach pain - continue with GI work-up - if hard stomach, no passing stool or gas - consider ER  #Referral - lung cancer screening I have placed a referral to a specialist for you. You should receive a phone call from the specialty office. Make sure your voicemail is not full and that if you are able to answer your phone to unknown or new numbers.   It may take up to 2 weeks to hear about the referral. If you do not hear anything in 2 weeks, please call our office and ask to speak with the referral coordinator.

## 2021-03-03 NOTE — Assessment & Plan Note (Signed)
Encouraged continuing with GI guidance. Discussed ER precautions. Cont bentyl for pain.

## 2021-03-04 ENCOUNTER — Other Ambulatory Visit: Payer: Self-pay | Admitting: Gastroenterology

## 2021-03-07 LAB — GI PROFILE, STOOL, PCR

## 2021-03-07 LAB — CALPROTECTIN, FECAL: Calprotectin, Fecal: 154 ug/g — ABNORMAL HIGH (ref 0–120)

## 2021-03-07 LAB — CLOSTRIDIUM DIFFICILE EIA: C difficile Toxins A+B, EIA: NEGATIVE

## 2021-03-11 ENCOUNTER — Telehealth: Payer: Self-pay

## 2021-03-11 MED ORDER — FIDAXOMICIN 200 MG PO TABS: 200.0000 mg | ORAL_TABLET | Freq: Two times a day (BID) | ORAL | 0 refills | Status: DC

## 2021-03-11 MED ORDER — DICYCLOMINE HCL 10 MG PO CAPS: 10.0000 mg | ORAL_CAPSULE | Freq: Four times a day (QID) | ORAL | 0 refills | Status: DC

## 2021-03-11 MED ORDER — VANCOMYCIN HCL 125 MG PO CAPS: 125.0000 mg | ORAL_CAPSULE | Freq: Four times a day (QID) | ORAL | 0 refills | Status: AC

## 2021-03-11 NOTE — Telephone Encounter (Signed)
-----   Message from Wyline Mood, MD sent at 03/10/2021  9:18 AM EDT ----- Kandis Cocking inform C diff toxin is positive and with ongoing diarrhea suggest dificid for 10 days , if insurance does not approve then vancomycin 125 mg QID for 14 days.   Can we push colonoscopy by another week later please   C/c Lynnda Child, MD    Dr Wyline Mood MD,MRCP [facility]) Gastroenterology/Hepatology Pager: [phone]

## 2021-03-11 NOTE — Telephone Encounter (Signed)
Called patient but had to leave her a detailed message letting her know about the antibiotic being sent to her pharmacy and to start them as soon as possible. I also let her know about pushing her colonoscopy a week later per Dr. Tobi Bastos. Waiting for patient to call back to reschedule her colonoscopy.

## 2021-03-11 NOTE — Telephone Encounter (Signed)
Patient called back letting me know that the Dificid was too expensive. Therefore, I told patient that Dr. Tobi Bastos had a second choice for a cheaper medication. So I told patient that I would go ahead and send it to her pharmacy. Patient agreed.

## 2021-03-11 NOTE — Telephone Encounter (Signed)
Patient called me back and I was able to give her results and Dr. Johnney Killian recommendations. Patient agreed and also stated that she would do her colonoscopy on 03/27/2021. Patient had no further questions.

## 2021-03-11 NOTE — Telephone Encounter (Signed)
Patient called wanting a refill on her Bentyl since she continues to have non-stop diarrhea. Patient also stated that she is not able to go to work today because she can't control her diarrhea. Therefore, she is requesting for a work note for today to send to her boss. Attention: Mrs. Luan Pulling [phone].

## 2021-03-11 NOTE — Addendum Note (Signed)
Addended by: Adela Ports on: 03/11/2021 09:51 AM   Modules accepted: Orders

## 2021-03-24 ENCOUNTER — Other Ambulatory Visit: Payer: Self-pay

## 2021-03-24 MED ORDER — PEG 3350-KCL-NA BICARB-NACL 420 G PO SOLR
ORAL | 0 refills | Status: DC
Start: 2021-03-24 — End: 2021-05-26

## 2021-03-26 ENCOUNTER — Ambulatory Visit: Payer: Self-pay | Admitting: Gastroenterology

## 2021-03-27 ENCOUNTER — Encounter
Admission: RE | Disposition: A | Discharge status: Home / Self Care | Payer: Self-pay | Source: Home / Self Care | Attending: Gastroenterology

## 2021-03-27 ENCOUNTER — Encounter: Payer: Self-pay | Admitting: Gastroenterology

## 2021-03-27 ENCOUNTER — Ambulatory Visit
Admission: RE | Admit: 2021-03-27 | Discharge: 2021-03-27 | Disposition: A | Discharge status: Home / Self Care | Payer: PRIVATE HEALTH INSURANCE | Attending: Gastroenterology | Admitting: Gastroenterology

## 2021-03-27 ENCOUNTER — Ambulatory Visit: Payer: PRIVATE HEALTH INSURANCE | Admitting: Anesthesiology

## 2021-03-27 DIAGNOSIS — Z79899 Other long term (current) drug therapy: Secondary | ICD-10-CM | POA: Diagnosis not present

## 2021-03-27 DIAGNOSIS — Z880 Allergy status to penicillin: Secondary | ICD-10-CM | POA: Insufficient documentation

## 2021-03-27 DIAGNOSIS — Z885 Allergy status to narcotic agent status: Secondary | ICD-10-CM | POA: Diagnosis not present

## 2021-03-27 DIAGNOSIS — K529 Noninfective gastroenteritis and colitis, unspecified: Secondary | ICD-10-CM | POA: Insufficient documentation

## 2021-03-27 DIAGNOSIS — K5730 Diverticulosis of large intestine without perforation or abscess without bleeding: Secondary | ICD-10-CM | POA: Insufficient documentation

## 2021-03-27 DIAGNOSIS — K635 Polyp of colon: Secondary | ICD-10-CM | POA: Diagnosis not present

## 2021-03-27 DIAGNOSIS — F17210 Nicotine dependence, cigarettes, uncomplicated: Secondary | ICD-10-CM | POA: Insufficient documentation

## 2021-03-27 HISTORY — PX: COLONOSCOPY WITH PROPOFOL: SHX5780

## 2021-03-27 SURGERY — COLONOSCOPY WITH PROPOFOL
Anesthesia: General

## 2021-03-27 MED ORDER — PROPOFOL 500 MG/50ML IV EMUL
INTRAVENOUS | Status: AC
  Filled 2021-03-27: qty 50

## 2021-03-27 MED ORDER — PROPOFOL 10 MG/ML IV BOLUS
INTRAVENOUS | Status: DC | PRN
  Administered 2021-03-27: 70 mg via INTRAVENOUS
  Administered 2021-03-27: 10 mg via INTRAVENOUS
  Administered 2021-03-27: 20 mg via INTRAVENOUS

## 2021-03-27 MED ORDER — PROPOFOL 500 MG/50ML IV EMUL
INTRAVENOUS | Status: DC | PRN
  Administered 2021-03-27: 150 ug/kg/min via INTRAVENOUS

## 2021-03-27 MED ORDER — SODIUM CHLORIDE 0.9 % IV SOLN: INTRAVENOUS | Status: DC

## 2021-03-27 MED ORDER — DICYCLOMINE HCL 10 MG PO CAPS: 10.0000 mg | ORAL_CAPSULE | Freq: Four times a day (QID) | ORAL | 0 refills | Status: DC

## 2021-03-27 MED ORDER — PHENYLEPHRINE HCL (PRESSORS) 10 MG/ML IV SOLN
INTRAVENOUS | Status: DC | PRN
  Administered 2021-03-27: 50 ug via INTRAVENOUS

## 2021-03-27 MED ORDER — LIDOCAINE HCL (CARDIAC) PF 100 MG/5ML IV SOSY
PREFILLED_SYRINGE | INTRAVENOUS | Status: DC | PRN
  Administered 2021-03-27: 50 mg via INTRAVENOUS

## 2021-03-27 NOTE — H&P (Signed)
Wyline Mood, MD [address], [address], [place], [place], [address] [address], [address], [place], [place], [address] Phone: [phone]  Fax: [phone]  Primary Care Physician:  Lynnda Child, MD   Pre-Procedure History & Physical: HPI:  Kathy Garcia is a 55 y.o. female is here for an colonoscopy.   Past Medical History:  Diagnosis Date   Allergy    Anxiety    COPD (chronic obstructive pulmonary disease) (HCC)    Depression    Gallstones and inflammation of gallbladder without obstruction 01/03/2013   GERD (gastroesophageal reflux disease)     Past Surgical History:  Procedure Laterality Date   CHOLECYSTECTOMY  2014   TUBAL LIGATION      Prior to Admission medications   Medication Sig Start Date End Date Taking? Authorizing Provider  albuterol (VENTOLIN HFA) 108 (90 Base) MCG/ACT inhaler Inhale 2 puffs into the lungs every 4 (four) hours as needed for wheezing. Patient not taking: Reported on 03/03/2021 06/13/20   Lynnda Child, MD  cholestyramine light (PREVALITE) 4 g packet Take 1 packet (4 g total) by mouth 2 (two) times daily. 02/26/21 03/26/22  Wyline Mood, MD  dicyclomine (BENTYL) 10 MG capsule Take 1 capsule (10 mg total) by mouth 4 (four) times daily for 14 days. 03/11/21 03/25/21  Wyline Mood, MD  ondansetron (ZOFRAN ODT) 4 MG disintegrating tablet Take 1 tablet (4 mg total) by mouth every 8 (eight) hours as needed for nausea or vomiting. 01/31/21   Lynnda Child, MD  polyethylene glycol-electrolytes (NULYTELY) 420 g solution Prepare according to package instructions. Starting at 5:00 PM: Drink one 8 oz glass of mixture every 15 minutes until you finish half of the jug. Five hours prior to procedure, drink 8 oz glass of mixture every 15 minutes until it is all gone. Make sure you do not drink anything 4 hours prior to your procedure. 03/24/21   Wyline Mood, MD    Allergies as of 02/26/2021 - Review Complete 02/26/2021  Allergen Reaction Noted    Hydrocodone Nausea And Vomiting 01/03/2013   Penicillin g Other (See Comments) 01/01/2014    Family History  Problem Relation Age of Onset   Hypertension Mother    Alzheimer's disease Mother    Diverticulitis Mother    Cancer Father        unknown type   Colon cancer Maternal Aunt    Heart failure Maternal Uncle    Stomach cancer Paternal Aunt    Breast cancer Maternal Aunt    Brain cancer Paternal Aunt    Breast cancer Paternal Aunt     Social History   Socioeconomic History   Marital status: Married    Spouse name: Sheria Lang   Number of children: 2   Years of education: some college   Highest education level: Not on file  Occupational History   Not on file  Tobacco Use   Smoking status: Every Day    Packs/day: 0.50    Years: 20.00    Pack years: 10.00    Types: Cigarettes   Smokeless tobacco: Never   Tobacco comments:    1 ppd x 20 years, quit for 10 years, restarted 2018  Vaping Use   Vaping Use: Never used  Substance and Sexual Activity   Alcohol use: Not Currently    Comment: none since 2018   Drug use: No   Sexual activity: Yes    Birth control/protection: Post-menopausal, Surgical  Other Topics Concern  Not on file  Social History Narrative   06/13/20   From: the area   Living: with husband Sheria Lang (2019), with husband's cousin Film/video editor   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      Family: 2 Scientist, research (medical) and Josetta Huddle - 3 grandchildren      Enjoys: writing, drawing, playing with cats, time with grandchildren      Exercise: not currently   Diet: nausea impacts diet      Safety   Seat belts: Yes    Guns: Yes  and secure   Safe in relationships: Yes    Social Determinants of Health   Financial Resource Strain: Not on file  Food Insecurity: Not on file  Transportation Needs: Not on file  Physical Activity: Not on file  Stress: Not on file  Social Connections: Not on file  Intimate Partner Violence: Not on file    Review of  Systems: See HPI, otherwise negative ROS  Physical Exam: BP 134/85   Pulse (!) 52   Temp (!) 96.6 F (35.9 C) (Temporal)   Resp 18   Ht 5\' 3"  (1.6 m)   Wt 70.3 kg   LMP 12/01/2012 (Exact Date)   SpO2 100%   BMI 27.46 kg/m  General:   Alert,  pleasant and cooperative in NAD Head:  Normocephalic and atraumatic. Neck:  Supple; no masses or thyromegaly. Lungs:  Clear throughout to auscultation, normal respiratory effort.    Heart:  +S1, +S2, Regular rate and rhythm, No edema. Abdomen:  Soft, nontender and nondistended. Normal bowel sounds, without guarding, and without rebound.   Neurologic:  Alert and  oriented x4;  grossly normal neurologically.  Impression/Plan: Kathy Garcia is here for an colonoscopy to be performed for diarrhea Risks, benefits, limitations, and alternatives regarding  colonoscopy have been reviewed with the patient.  Questions have been answered.  All parties agreeable.   Virgie Dad, MD  03/27/2021, 10:21 AM

## 2021-03-27 NOTE — Op Note (Signed)
[facility] Gastroenterology Patient Name: Kathy Garcia Procedure Date: 03/27/2021 11:06 AM MRN: [id number] Account #: [id number] Date of Birth: 1965-09-29 Admit Type: Outpatient Age: 55 Room: [facility] ENDO ROOM 3 Gender: Female Note Status: Finalized Procedure:             Colonoscopy Indications:           Chronic diarrhea Providers:             Wyline Mood MD, MD Referring MD:          Chryl Heck. Selena Batten (Referring MD) Medicines:             Monitored Anesthesia Care Complications:         No immediate complications. Procedure:             Pre-Anesthesia Assessment:                        - Prior to the procedure, a History and Physical was                         performed, and patient medications, allergies and                         sensitivities were reviewed. The patient's tolerance                         of previous anesthesia was reviewed.                        - The risks and benefits of the procedure and the                         sedation options and risks were discussed with the                         patient. All questions were answered and informed                         consent was obtained.                        - ASA Grade Assessment: II - A patient with mild                         systemic disease.                        After obtaining informed consent, the colonoscope was                         passed under direct vision. Throughout the procedure,                         the patient's blood pressure, pulse, and oxygen                         saturations were monitored continuously. The                         Colonoscope was introduced through the anus and  advanced to the the cecum, identified by the                         appendiceal orifice. The colonoscopy was performed                         with ease. The patient tolerated the procedure well.                         The quality of the bowel preparation was  adequate to                         identify polyps. Findings:      The perianal and digital rectal examinations were normal.      Multiple small-mouthed diverticula were found in the sigmoid colon.      A 6 mm polyp was found in the ascending colon. The polyp was sessile.       The polyp was removed with a cold snare. Resection and retrieval were       complete.      Normal mucosa was found in the entire colon. Biopsies for histology were       taken with a cold forceps from the entire colon for evaluation of       microscopic colitis.      The exam was otherwise without abnormality on direct and retroflexion       views. Impression:            - Diverticulosis in the sigmoid colon.                        - One 6 mm polyp in the ascending colon, removed with                         a cold snare. Resected and retrieved.                        - Normal mucosa in the entire examined colon. Biopsied.                        - The examination was otherwise normal on direct and                         retroflexion views. Recommendation:        - Discharge patient to home (with escort).                        - Resume previous diet.                        - Continue present medications.                        - Await pathology results.                        - Repeat colonoscopy for surveillance based on                         pathology results.                        -  Return to GI office as previously scheduled. Procedure Code(s):     --- Professional ---                        [phone], Colonoscopy, flexible; with removal of                         tumor(s), polyp(s), or other lesion(s) by snare                         technique                        45380, 59, Colonoscopy, flexible; with biopsy, single                         or multiple Diagnosis Code(s):     --- Professional ---                        K63.5, Polyp of colon                        K52.9, Noninfective gastroenteritis and  colitis,                         unspecified                        K57.30, Diverticulosis of large intestine without                         perforation or abscess without bleeding CPT copyright 2019 American Medical Association. All rights reserved. The codes documented in this report are preliminary and upon coder review may  be revised to meet current compliance requirements. Wyline Mood, MD Wyline Mood MD, MD 03/27/2021 11:36:45 AM This report has been signed electronically. Number of Addenda: 0 Note Initiated On: 03/27/2021 11:06 AM Scope Withdrawal Time: 0 hours 10 minutes 36 seconds  Total Procedure Duration: 0 hours 13 minutes 5 seconds  Estimated Blood Loss:  Estimated blood loss: none.      [facility]

## 2021-03-27 NOTE — Anesthesia Postprocedure Evaluation (Signed)
Anesthesia Post Note  Patient: Kathy Garcia  Procedure(s) Performed: COLONOSCOPY WITH PROPOFOL  Patient location during evaluation: Endoscopy Anesthesia Type: General Level of consciousness: awake and alert Pain management: pain level controlled Vital Signs Assessment: post-procedure vital signs reviewed and stable Respiratory status: spontaneous breathing, nonlabored ventilation, respiratory function stable and patient connected to nasal cannula oxygen Cardiovascular status: blood pressure returned to baseline and stable Postop Assessment: no apparent nausea or vomiting Anesthetic complications: no   No notable events documented.   Last Vitals:  Vitals:   03/27/21 1148 03/27/21 1158  BP: 128/71 117/64  Pulse:    Resp:    Temp:    SpO2:      Last Pain:  Vitals:   03/27/21 1158  TempSrc:   PainSc: 0-No pain                 Lenard Simmer

## 2021-03-27 NOTE — Transfer of Care (Signed)
Immediate Anesthesia Transfer of Care Note  Patient: Kathy Garcia  Procedure(s) Performed: COLONOSCOPY WITH PROPOFOL  Patient Location: PACU  Anesthesia Type:General  Level of Consciousness: awake and drowsy  Airway & Oxygen Therapy: Patient Spontanous Breathing  Post-op Assessment: Report given to RN and Post -op Vital signs reviewed and stable  Post vital signs: Reviewed and stable  Last Vitals:  Vitals Value Taken Time  BP 112/71 03/27/21 1138  Temp 36.4 C 03/27/21 1138  Pulse 69 03/27/21 1138  Resp 10 03/27/21 1138  SpO2 100 % 03/27/21 1138  Vitals shown include unvalidated device data.  Last Pain:  Vitals:   03/27/21 1138  TempSrc: Temporal  PainSc: 0-No pain         Complications: No notable events documented.

## 2021-03-27 NOTE — Anesthesia Preprocedure Evaluation (Signed)
Anesthesia Evaluation  Patient identified by MRN, date of birth, ID band Patient awake    Reviewed: Allergy & Precautions, H&P , NPO status , Patient's Chart, lab work & pertinent test results, reviewed documented beta blocker date and time   History of Anesthesia Complications Negative for: history of anesthetic complications  Airway Mallampati: I  TM Distance: >3 FB Neck ROM: full    Dental  (+) Dental Advidsory Given, Caps, Missing, Teeth Intact   Pulmonary neg shortness of breath, asthma , COPD,  COPD inhaler, neg recent URI, Current Smoker,    Pulmonary exam normal breath sounds clear to auscultation       Cardiovascular Exercise Tolerance: Good negative cardio ROS Normal cardiovascular exam Rhythm:regular Rate:Normal     Neuro/Psych PSYCHIATRIC DISORDERS Anxiety Depression negative neurological ROS     GI/Hepatic Neg liver ROS, GERD  ,  Endo/Other  negative endocrine ROS  Renal/GU negative Renal ROS  negative genitourinary   Musculoskeletal   Abdominal   Peds  Hematology negative hematology ROS (+)   Anesthesia Other Findings Past Medical History: No date: Allergy No date: Anxiety No date: COPD (chronic obstructive pulmonary disease) (HCC) No date: Depression 01/03/2013: Gallstones and inflammation of gallbladder without  obstruction No date: GERD (gastroesophageal reflux disease)   Reproductive/Obstetrics negative OB ROS                             Anesthesia Physical Anesthesia Plan  ASA: 3  Anesthesia Plan: General   Post-op Pain Management:    Induction: Intravenous  PONV Risk Score and Plan: 2 and TIVA and Propofol infusion  Airway Management Planned: Natural Airway and Nasal Cannula  Additional Equipment:   Intra-op Plan:   Post-operative Plan:   Informed Consent: I have reviewed the patients History and Physical, chart, labs and discussed the procedure  including the risks, benefits and alternatives for the proposed anesthesia with the patient or authorized representative who has indicated his/her understanding and acceptance.     Dental Advisory Given  Plan Discussed with: Anesthesiologist, CRNA and Surgeon  Anesthesia Plan Comments:         Anesthesia Quick Evaluation

## 2021-03-28 ENCOUNTER — Encounter: Payer: Self-pay | Admitting: Gastroenterology

## 2021-03-28 LAB — SURGICAL PATHOLOGY

## 2021-04-01 ENCOUNTER — Encounter: Payer: Self-pay | Admitting: Gastroenterology

## 2021-04-02 ENCOUNTER — Telehealth (INDEPENDENT_AMBULATORY_CARE_PROVIDER_SITE_OTHER): Payer: No Typology Code available for payment source | Admitting: Gastroenterology

## 2021-04-02 DIAGNOSIS — K9089 Other intestinal malabsorption: Secondary | ICD-10-CM | POA: Diagnosis not present

## 2021-04-02 DIAGNOSIS — Z8619 Personal history of other infectious and parasitic diseases: Secondary | ICD-10-CM | POA: Diagnosis not present

## 2021-04-02 NOTE — Progress Notes (Signed)
Wyline Mood , MD [address]  [address]  [place], [place] [address]  Main: [phone]  Fax: [phone]   Primary Care Physician: Lynnda Child, MD  Virtual Visit via Video Note  I connected with patient on 04/02/21 at  8:45 AM EDT by video and verified that I am speaking with the correct person using two identifiers.   I discussed the limitations, risks, security and privacy concerns of performing an evaluation and management service by video  and the availability of in person appointments. I also discussed with the patient that there may be a patient responsible charge related to this service. The patient expressed understanding and agreed to proceed.  Location of Patient: Home Location of Provider: Home Persons involved: Patient and provider only   History of Present Illness:   C/c : Chronic diarrhea    HPI: Kathy Garcia is a 55 y.o. female  Summary of history :   Initially seen and referred on 02/26/2021 for chronic diarrhea and abdominal pain.  Recently seen in the ER on 01/28/2021 for chronic abdominal pain and diarrhea.  At that point of time responded that she had abdominal pain and issues with constipation for over a year and a half.  Had been on antibiotics in March for a tooth extraction.  01/28/2021 CT scan of the abdomen and pelvis showed extensive colonic diverticulosis no evidence of diverticulitis left adrenal nodule otherwise normal. Undergone laparoscopic cholecystectomy in 2014   01/28/2021 C. difficile testing and GI PCR were negative but the C. difficile quick test was positive for antigen   Although her stool test was positive for the antigen in the setting of diarrhea she was not given any antibiotics at that time fecal calprotectin has been elevated in June 2022 celiac panel in June 2022 was negative   She states that she has had diarrhea since 2014 when she had her gallbladder taken out.  Presently has ongoing diarrhea multiple times a  day.  Very watery in nature.  Nonbloody.  She takes Imodium up to 4 times a day along with iron pills to help her with the diarrhea.  Not much artificial sugars in her diet.  Denies any NSAID use.  Denies any prior colonoscopy.  Father had colon polyps.  Has lost over 50 pounds of weight.   Denies any clear abdominal pain but has cramping in the lower part of the abdomen relieved after bowel movement.   Interval history   02/26/2021-04/02/2021  03/04/2021: GI pcr positive for C diff and given course of Vancomycin for 14 days. Commenced on Questran after.  03/27/2021: Colonoscopy with random colon biopsies showed no evidence of microscopic colitis.    She is doing well on Bentyl and questran PRN- diarrhea has resolved. Ocasional abdominal pain.      Current Outpatient Medications  Medication Sig Dispense Refill   albuterol (VENTOLIN HFA) 108 (90 Base) MCG/ACT inhaler Inhale 2 puffs into the lungs every 4 (four) hours as needed for wheezing. (Patient not taking: Reported on 03/03/2021) 8 g 1   cholestyramine light (PREVALITE) 4 g packet Take 1 packet (4 g total) by mouth 2 (two) times daily. 180 packet 4   dicyclomine (BENTYL) 10 MG capsule Take 1 capsule (10 mg total) by mouth 4 (four) times daily for 14 days. 56 capsule 0   ondansetron (ZOFRAN ODT) 4 MG disintegrating tablet Take 1 tablet (4 mg total) by mouth every 8 (eight) hours as needed for nausea or vomiting. 20  tablet 0   polyethylene glycol-electrolytes (NULYTELY) 420 g solution Prepare according to package instructions. Starting at 5:00 PM: Drink one 8 oz glass of mixture every 15 minutes until you finish half of the jug. Five hours prior to procedure, drink 8 oz glass of mixture every 15 minutes until it is all gone. Make sure you do not drink anything 4 hours prior to your procedure. 4000 mL 0   No current facility-administered medications for this visit.    Allergies as of 04/02/2021 - Review Complete 03/27/2021  Allergen Reaction  Noted   Hydrocodone Nausea And Vomiting 01/03/2013   Penicillin g Other (See Comments) 01/01/2014    Review of Systems:    All systems reviewed and negative except where noted in HPI.  General Appearance:    Alert, cooperative, no distress, appears stated age  Head:    Normocephalic, without obvious abnormality, atraumatic  Eyes:    PERRL, conjunctiva/corneas clear,  Ears:    Grossly normal hearing    Neurologic:  Grossly normal    Observations/Objective:  Labs: CMP     Component Value Date/Time   NA 140 01/28/2021 1030   NA 140 01/04/2013 1036   K 3.9 01/28/2021 1030   K 4.0 01/04/2013 1036   CL 104 01/28/2021 1030   CL 105 01/04/2013 1036   CO2 29 01/28/2021 1030   CO2 28 01/04/2013 1036   GLUCOSE 104 (H) 01/28/2021 1030   GLUCOSE 105 (H) 01/04/2013 1036   BUN 25 (H) 01/28/2021 1030   BUN 10 01/04/2013 1036   CREATININE 0.79 01/28/2021 1030   CREATININE 0.89 01/04/2013 1036   CALCIUM 9.6 01/28/2021 1030   CALCIUM 9.0 01/04/2013 1036   PROT 7.9 01/28/2021 1030   PROT 7.8 01/04/2013 1036   ALBUMIN 4.6 01/28/2021 1030   ALBUMIN 3.4 01/04/2013 1036   AST 16 01/28/2021 1030   AST 20 01/04/2013 1036   ALT 15 01/28/2021 1030   ALT 32 01/04/2013 1036   ALKPHOS 75 01/28/2021 1030   ALKPHOS 92 01/04/2013 1036   BILITOT 0.9 01/28/2021 1030   BILITOT 0.4 01/04/2013 1036   GFRNONAA >60 01/28/2021 1030   GFRNONAA >60 01/04/2013 1036   GFRAA >60 02/01/2019 2109   GFRAA >60 01/04/2013 1036   Lab Results  Component Value Date   WBC 8.9 01/28/2021   HGB 13.8 01/28/2021   HCT 40.6 01/28/2021   MCV 100.2 (H) 01/28/2021   PLT 202 01/28/2021    Imaging Studies: No results found.  Assessment and Plan:   Kathy Garcia is a 55 y.o. y/o female here to follow up for chronic abdominal pain and diarrhea.  Likely a background of bile salt diet s/p cholecystectomy with acute c diff diarrhea which has been treated   Plan 1.  Stop benyl and zofran and if does well can  gadualy wean off Smitty Pluck to lowest possible dose    I discussed the assessment and treatment plan with the patient. The patient was provided an opportunity to ask questions and all were answered. The patient agreed with the plan and demonstrated an understanding of the instructions.   The patient was advised to call back or seek an in-person evaluation if the symptoms worsen or if the condition fails to improve as anticipated.  I provided 20 minutes of face-to-face time during this encounter.  Dr Wyline Mood MD,MRCP [facility]) Gastroenterology/Hepatology Pager: [phone]   Speech recognition software was used to dictate this note.

## 2021-04-04 ENCOUNTER — Telehealth: Payer: Self-pay | Admitting: Gastroenterology

## 2021-04-04 NOTE — Telephone Encounter (Signed)
Patient Kathy Garcia, is very constipated and making her nauseous. What can she do, as she had to leave work last night?

## 2021-04-09 ENCOUNTER — Telehealth: Payer: Self-pay | Admitting: Gastroenterology

## 2021-04-09 NOTE — Telephone Encounter (Signed)
Pt. Requesting a call back she says meds are making her sick and constipated.

## 2021-04-09 NOTE — Telephone Encounter (Signed)
Stop Questran and see how she does

## 2021-04-09 NOTE — Telephone Encounter (Signed)
Patient stated that she is having nausea and constipation with her medication. She stated that she couldn't stay at work so she had to go home. Patient requesting a phone note. Patient denied fever, chills, vomiting, diarrhea. Please advise.

## 2021-04-10 NOTE — Telephone Encounter (Signed)
Called patient back and left her a detailed voicemail letting her know that Dr. Tobi Bastos recommends for her to stop taking Questran. Patient was informed to call us back if she continued to have the same symptoms after stopping Questran.

## 2021-04-17 ENCOUNTER — Telehealth: Payer: Self-pay | Admitting: Gastroenterology

## 2021-04-17 NOTE — Telephone Encounter (Signed)
Patient returning  call she said her phone was messed up and she has it fixed now.

## 2021-05-14 ENCOUNTER — Other Ambulatory Visit: Payer: Self-pay | Admitting: Gastroenterology

## 2021-05-26 ENCOUNTER — Other Ambulatory Visit: Payer: Self-pay | Admitting: Family Medicine

## 2021-05-26 ENCOUNTER — Other Ambulatory Visit (INDEPENDENT_AMBULATORY_CARE_PROVIDER_SITE_OTHER): Payer: No Typology Code available for payment source

## 2021-05-26 ENCOUNTER — Telehealth (INDEPENDENT_AMBULATORY_CARE_PROVIDER_SITE_OTHER): Payer: No Typology Code available for payment source | Admitting: Family Medicine

## 2021-05-26 ENCOUNTER — Other Ambulatory Visit: Payer: Self-pay

## 2021-05-26 ENCOUNTER — Encounter: Payer: Self-pay | Admitting: Family Medicine

## 2021-05-26 VITALS — BP 156/88 | HR 86 | Wt 150.0 lb

## 2021-05-26 DIAGNOSIS — R52 Pain, unspecified: Secondary | ICD-10-CM | POA: Diagnosis not present

## 2021-05-26 DIAGNOSIS — Z20822 Contact with and (suspected) exposure to covid-19: Secondary | ICD-10-CM

## 2021-05-26 DIAGNOSIS — R102 Pelvic and perineal pain: Secondary | ICD-10-CM | POA: Diagnosis not present

## 2021-05-26 DIAGNOSIS — R112 Nausea with vomiting, unspecified: Secondary | ICD-10-CM | POA: Diagnosis not present

## 2021-05-26 DIAGNOSIS — R509 Fever, unspecified: Secondary | ICD-10-CM | POA: Diagnosis not present

## 2021-05-26 LAB — POCT URINALYSIS DIPSTICK
Bilirubin, UA: NEGATIVE
Blood, UA: NEGATIVE
Glucose, UA: NEGATIVE
Ketones, UA: NEGATIVE
Leukocytes, UA: NEGATIVE
Nitrite, UA: NEGATIVE
Protein, UA: NEGATIVE
Spec Grav, UA: 1.015 (ref 1.010–1.025)
Urobilinogen, UA: 0.2 E.U./dL
pH, UA: 6 (ref 5.0–8.0)

## 2021-05-26 LAB — POCT INFLUENZA A/B
Influenza A, POC: NEGATIVE
Influenza B, POC: NEGATIVE

## 2021-05-26 NOTE — Progress Notes (Addendum)
I connected with Kathy Garcia on 05/26/21 at 12:20 PM EDT by video and verified that I am speaking with the correct person using two identifiers.   I discussed the limitations, risks, security and privacy concerns of performing an evaluation and management service by video and the availability of in person appointments. I also discussed with the patient that there may be a patient responsible charge related to this service. The patient expressed understanding and agreed to proceed.  Patient location: Home Provider Location: [facility] Participants: Lynnda Child and Kathy Garcia   Subjective:     Kathy Garcia is a 55 y.o. female presenting for Generalized Body Aches, Emesis (Started this morning ), and Fever (99 temporal this morning)     Emesis  Associated symptoms include abdominal pain (suprapubic), arthralgias, chest pain (comes and goes), a fever and myalgias. Pertinent negatives include no chills, coughing or diarrhea.  Fever  Associated symptoms include abdominal pain (suprapubic), chest pain (comes and goes), nausea, urinary pain (last week) and vomiting. Pertinent negatives include no congestion, coughing, diarrhea or sore throat.   # Body aches - body aches - nausea - started 5 am - no known sick contact - bp has been high 144/94, 81 hr -   Pain w/ urination - pelvic pain with urination - frequency - no burning with urination - was worse last week - no blood in the urine - endorses some low back pain  Review of Systems  Constitutional:  Positive for fever. Negative for chills.  HENT:  Positive for postnasal drip and rhinorrhea. Negative for congestion, sinus pressure, sinus pain and sore throat.   Respiratory:  Negative for cough and shortness of breath.   Cardiovascular:  Positive for chest pain (comes and goes).  Gastrointestinal:  Positive for abdominal pain (suprapubic), nausea and vomiting. Negative for diarrhea.  Genitourinary:   Positive for dysuria (last week).  Musculoskeletal:  Positive for arthralgias and myalgias.    Social History   Tobacco Use  Smoking Status Every Day   Packs/day: 0.50   Years: 20.00   Pack years: 10.00   Types: Cigarettes  Smokeless Tobacco Never  Tobacco Comments   1 ppd x 20 years, quit for 10 years, restarted 2018        Objective:   BP Readings from Last 3 Encounters:  05/26/21 (!) 156/88  03/27/21 117/64  03/03/21 132/76   Wt Readings from Last 3 Encounters:  05/26/21 150 lb (68 kg)  03/27/21 155 lb (70.3 kg)  03/03/21 156 lb (70.8 kg)   BP (!) 156/88   Pulse 86   Wt 150 lb (68 kg)   LMP 12/01/2012 (Exact Date)   BMI 26.57 kg/m   Physical Exam Constitutional:      Appearance: Normal appearance. She is not ill-appearing.  HENT:     Head: Normocephalic and atraumatic.     Right Ear: External ear normal.     Left Ear: External ear normal.  Eyes:     Conjunctiva/sclera: Conjunctivae normal.  Pulmonary:     Effort: Pulmonary effort is normal. No respiratory distress.  Neurological:     Mental Status: She is alert. Mental status is at baseline.  Psychiatric:        Mood and Affect: Mood normal.        Behavior: Behavior normal.        Thought Content: Thought content normal.        Judgment: Judgment normal.  Assessment & Plan:   Problem List Items Addressed This Visit   None Visit Diagnoses     Suprapubic abdominal pain    -  Primary   Nausea and vomiting, unspecified vomiting type       Body aches          Etiology unclear. Virtual visit and unable to do exam  Concern for UTI - she will drop of urine and treat for possible pyelonephritis given n/v and fever - anticipate bactrim  Concern for flu - will test and treat with tamiflu  Discussed possible covid - will test, is eligible for treatment if positive  Has zofran - encouraged prn use, hydration  ER precautions discussed  Return if symptoms worsen or fail to  improve.  Lynnda Child, MD  Addendum:  UA w/o signs of infection, but will check culture given symptoms Flu - negative Will await covid. Suspect stomach virus

## 2021-05-26 NOTE — Addendum Note (Signed)
Addended by: Erby Pian on: 05/26/2021 04:01 PM   Modules accepted: Orders

## 2021-05-27 LAB — NOVEL CORONAVIRUS, NAA: SARS-CoV-2, NAA: NOT DETECTED

## 2021-05-27 LAB — URINE CULTURE
MICRO NUMBER:: 12542079
SPECIMEN QUALITY:: ADEQUATE

## 2021-05-27 LAB — SPECIMEN STATUS REPORT

## 2021-05-27 LAB — SARS-COV-2, NAA 2 DAY TAT

## 2021-05-28 ENCOUNTER — Telehealth: Payer: Self-pay | Admitting: Family Medicine

## 2021-05-28 NOTE — Telephone Encounter (Signed)
Pt husband called to get pt lab results

## 2021-05-28 NOTE — Telephone Encounter (Signed)
Spoke to pt and relayed all of her lab results, per Dr. Selena Batten. Pt does not have a DPR on file so would not have been able to speak to husband.

## 2021-06-11 ENCOUNTER — Other Ambulatory Visit: Payer: Self-pay | Admitting: Gastroenterology

## 2021-07-01 NOTE — Telephone Encounter (Signed)
error 

## 2021-08-13 ENCOUNTER — Telehealth: Payer: Self-pay

## 2021-08-13 NOTE — Telephone Encounter (Signed)
Noted.   Can we check on patient 08/14/21 or 08/15/21?

## 2021-08-13 NOTE — Telephone Encounter (Signed)
Kathy Garcia - Client TELEPHONE ADVICE RECORD [facility] Patient Name: Kathy Garcia Gender: Female DOB: 1965-09-24 Age: 56 Y 53 M 30 D Return Phone Number: [phone] (Primary) Address: City/ State/ Zip: [place] [address] Client [facility] Garcia - Client Client Site [place] - Garcia Provider Waunita Schooner- MD Contact Type Call Who Is Calling Patient / Member / Family / Caregiver Call Type Triage / Clinical Caller Name Cam Relationship To Patient Spouse Return Phone Number [phone] (Primary) Chief Complaint NUMBNESS/TINGLING- sudden on one side of the body or face Reason for Call Symptomatic / Request for Kathy Garcia 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 Vomiting but not every Garcia but every now and than. Pain in her neck going down to her arm making her hand numb that has been going on for awhile. Translation No Nurse Assessment Nurse: Louretta Shorten, RN, [place] Date/Time Eilene Ghazi Time): 08/13/2021 3:19:55 PM Confirm and document reason for call. If symptomatic, describe symptoms. ---Caller states pt is still having dull RLQ abdominal pain with intermittent abdominal pain -caller states she is having dull,constant pain (7/10) since her colonoscopy in 3 days. Caller states she has not been able to eat the last 3 days, but she was able to eat today. Caller states she has been taking zofran PRN. Caller states her colonoscopy was clear. Caller states she is also having neck pain (8/10)- caller reports sharp shooting pain when touching her chin to her test. Caller also states the pain radiates to her Lt arm. Does the patient have any new or worsening symptoms? ---Yes Will a triage be completed? ---Yes Related visit to physician within the last 2 weeks? ---No Does the PT have any chronic conditions?  (i.e. diabetes, asthma, this includes High risk factors for pregnancy, etc.) ---No Is this a behavioral health or substance abuse call? ---No PLEASE NOTE: All timestamps contained within this report are represented as [place] Standard Time. CONFIDENTIALTY NOTICE: This fax transmission is intended only for the addressee. It contains information that is legally privileged, confidential or otherwise protected from use or disclosure. If you are not the intended recipient, you are strictly prohibited from reviewing, disclosing, copying using or disseminating any of this information or taking any action in reliance on or regarding this information. If you have received this fax in error, please notify us immediately by telephone so that we can arrange for its return to [place]. Phone: [phone], Toll-Free: [phone], Fax: [phone] Page: 2 of 2 Call Id: [id number] Guidelines Guideline Title Affirmed Question Affirmed Notes Nurse Date/Time Eilene Ghazi Time) Neck Pain or Stiffness Difficulty breathing or unusual sweating (e.g., sweating without exertion) Louretta Shorten, RN, [place] 08/13/2021 3:24:17 PM Disp. Time Eilene Ghazi Time) Disposition Final User 08/13/2021 3:18:31 PM Send to Urgent Tawanna Cooler 08/13/2021 3:28:45 PM Go to ED Now Yes Louretta Shorten, RN, [place] Caller Disagree/Comply Comply Caller Understands Yes PreDisposition InappropriateToAsk Care Advice Given Per Guideline GO TO ED NOW: * You need to be seen in the Emergency Department. * Go to the ED at ___________ [place] now. Drive carefully. BRING MEDICINES: * Bring a list of your current medicines when you go to the Emergency Department (ER). * Another adult should drive. Comments User: [place], [place], RN Date/Time Eilene Ghazi Time): 08/13/2021 3:25:26 PM Caller states neck pain has been constant x 2 weeks. States abdominal pain has been present x 2 years. User: [place], Garrison,  RN Date/Time Eilene Ghazi Time): 08/13/2021 3:29:45  PM Caller states GI prescribed Prevalite for her to take. Referrals GO TO FACILITY UNDECIDE

## 2021-08-13 NOTE — Telephone Encounter (Signed)
I spoke with pt and she feels better today and does not want to go to ED or UC. For 3 days pt was not able to keep anything down and even though she thinks she feels so better pt still has abd pain with pain level of 7, dry mouth and lightheadedness. I advised pt she could be dehydrated and needs to go to ED for eval and possible IV fluids. Pt said she will consider it if she feels worse. Sending note to Gentry Fitz NP, Joellen CMA and will teams [place] CMA. ED precautions given again and pt voiced understanding and said her husband is there and if she feels worse or has signs of dehydration she will go to ED. I advised again that the abd pain, lightheadedness and dry mouth were possible symptoms of dehydration and pt voiced understanding and said she will see how she feels.

## 2021-08-15 NOTE — Telephone Encounter (Signed)
Attempted to call pt and check on her. Called home and cell, and was unable to leave a message on either line. Will send a mychart message to pt.

## 2021-08-25 ENCOUNTER — Encounter: Payer: Self-pay | Admitting: Family Medicine

## 2021-09-04 NOTE — Telephone Encounter (Signed)
Not able to reach pt by phone and mychart message sent to pt has not been read yet.

## 2021-09-10 ENCOUNTER — Telehealth: Payer: Self-pay

## 2021-09-10 ENCOUNTER — Ambulatory Visit: Payer: No Typology Code available for payment source | Admitting: Family Medicine

## 2021-09-10 NOTE — Telephone Encounter (Signed)
Noted  

## 2021-09-10 NOTE — Telephone Encounter (Signed)
[facility] Night - Client Nonclinical Telephone Record  [facility] Client [facility] Night - Client Client Site [facility] - Night Provider Gweneth Dimitri- MD Contact Type Call Who Is Calling Patient / Member / Family / Caregiver Caller Name Leontine Radman Caller Phone Number [phone] Patient Name Kathy Garcia Patient DOB Oct 22, 1965 Call Type Message Only Information Provided Reason for Call Request to Schedule Office Appointment Initial Comment Caller wants to schedule an appointment. Patient request to speak to RN No Additional Comment Provided office hours. Declined triage. Disp. Time Disposition Final User 09/10/2021 10:23:52 AM General Information Provided Yes Fifer, Nash Dimmer Call Closed By: Rhetta Mura Transaction Date/Time: 09/10/2021 10:13:57 AM (ET

## 2021-09-10 NOTE — Telephone Encounter (Signed)
Unable to reach pt by phone; no answer and no v/m set up. Sending to lsc support.

## 2021-09-10 NOTE — Telephone Encounter (Signed)
Kathy Garcia called back; Kathy Garcia said she wants appt to be in office to discuss her symptoms. Kathy Garcia said she has same abd pain daily but varies with intensity. Kathy Garcia said today is not bad pain;tolerable. Kathy Garcia said having a lot of nausea and not gaining wt; Kathy Garcia has weighed 140.2 lbs for 1 month. Kathy Garcia saw GI a;nd Kathy Garcia was released back to PCP per Kathy Garcia. Kathy Garcia refuses virtual appt and I spoke with Dr Einar Pheasant who said Kathy Garcia could schedule virtual or first available appt with Dr Einar Pheasant in office. UC & ED precautions given and Kathy Garcia voiced understanding and scheduled in office with Dr cody on 09/22/21 at 9:20. Will send note to Dr Einar Pheasant and [facility] CMA.

## 2021-09-22 ENCOUNTER — Other Ambulatory Visit: Payer: Self-pay

## 2021-09-22 ENCOUNTER — Encounter: Payer: Self-pay | Admitting: Family Medicine

## 2021-09-22 ENCOUNTER — Ambulatory Visit (INDEPENDENT_AMBULATORY_CARE_PROVIDER_SITE_OTHER): Payer: No Typology Code available for payment source | Admitting: Family Medicine

## 2021-09-22 VITALS — BP 140/82 | HR 78 | Temp 98.2°F | Ht 63.5 in | Wt 137.6 lb

## 2021-09-22 DIAGNOSIS — R109 Unspecified abdominal pain: Secondary | ICD-10-CM

## 2021-09-22 DIAGNOSIS — R1031 Right lower quadrant pain: Secondary | ICD-10-CM | POA: Diagnosis not present

## 2021-09-22 DIAGNOSIS — R112 Nausea with vomiting, unspecified: Secondary | ICD-10-CM | POA: Insufficient documentation

## 2021-09-22 DIAGNOSIS — G8929 Other chronic pain: Secondary | ICD-10-CM

## 2021-09-22 DIAGNOSIS — Z1231 Encounter for screening mammogram for malignant neoplasm of breast: Secondary | ICD-10-CM

## 2021-09-22 DIAGNOSIS — R634 Abnormal weight loss: Secondary | ICD-10-CM

## 2021-09-22 DIAGNOSIS — F411 Generalized anxiety disorder: Secondary | ICD-10-CM | POA: Diagnosis not present

## 2021-09-22 DIAGNOSIS — Z720 Tobacco use: Secondary | ICD-10-CM

## 2021-09-22 DIAGNOSIS — F331 Major depressive disorder, recurrent, moderate: Secondary | ICD-10-CM

## 2021-09-22 DIAGNOSIS — K529 Noninfective gastroenteritis and colitis, unspecified: Secondary | ICD-10-CM

## 2021-09-22 MED ORDER — METOCLOPRAMIDE HCL 5 MG/5ML PO SOLN: 5.0000 mg | Freq: Two times a day (BID) | ORAL | 1 refills | Status: DC

## 2021-09-22 MED ORDER — BUPROPION HCL ER (XL) 150 MG PO TB24: 150.0000 mg | ORAL_TABLET | Freq: Every day | ORAL | 1 refills | Status: DC

## 2021-09-22 MED ORDER — DICYCLOMINE HCL 10 MG PO CAPS: 10.0000 mg | ORAL_CAPSULE | Freq: Three times a day (TID) | ORAL | 0 refills | Status: DC

## 2021-09-22 NOTE — Patient Instructions (Addendum)
Changes - Start Wellbutrin 150 mg daily  - update in 2 weeks if tolerating - can increase 300 mg - stop smoking within 2 weeks  Abdominal pain - Try Bentyl - up to 3 times a day  - stop if no improvement within 2-3 weeks  Nausea - try reglan with meals - stop if no improvement after 1 week    #Referral I have placed a referral to a specialist for you. You should receive a phone call from the specialty office. Make sure your voicemail is not full and that if you are able to answer your phone to unknown or new numbers.   It may take up to 2 weeks to hear about the referral. If you do not hear anything in 2 weeks, please call our office and ask to speak with the referral coordinator.      You are going to start a new antidepressant medication.   One of the risks of this medication is increase in suicidal thoughts.   Your suicide Action plan is as follows:  1) Call partner 2) Call the Suicide Hotline 4630754160 which is available 24 hours 3) Call the Clinic    The most common side effect is stomach upset. If this happens it means the medication is working. It should get better in 1-3 weeks.   Medication for depression and anxiety often takes 6-8 weeks to have a noticeable difference so stick with it. Also the best way for recovery is taking medication and seeing a therapist -- this is so important.    How to help anxiety and depression  1) Regular Exercise - walking, jogging, cycling, dancing, strength training - aiming for 150 minutes of exercise a week --> Yoga has been shown in research to reduce depression and anxiety -- with even just one hour long session per week  2)  Begin a Mindfulness/Meditation practice -- this can take a little as 3 minutes and is helpful for all kinds of mood issues -- You can find resources in books -- Or you can download apps like  ---- Headspace App  ---- Calm  ---- Insignt Timer ---- Stop, Breathe & Think  # With each of these Apps  - you should decline the "start free trial" offer and as you search through the App should be able to access some of their free content. You can also chose to pay for the content if you find one that works well for you.   # Many of them also offer sleep specific content which may help with insomnia  3) Healthy Diet -- Avoid or decrease Caffeine -- Avoid or decrease Alcohol -- Drink plenty of water, have a balanced diet -- Avoid cigarettes and marijuana (as well as other recreational drugs)  4) Find a therapist  -- [facility] is one option. Call [phone] -- Or you can check out www.psychologytoday.com -- you can read bios of therapists and see if they accept insurance -- Check with your insurance to see if you have coverage and who may take your insurance

## 2021-09-22 NOTE — Assessment & Plan Note (Signed)
Rare use of Zofran but persistent daily nausea.  We will try Reglan and to see if she responds to this.  Update in 1 to 2 weeks

## 2021-09-22 NOTE — Assessment & Plan Note (Signed)
Starting Wellbutrin again for ongoing mood mood disorders.  Advised quitting smoking 2 weeks then.  If ineffective will consider nicotine replacement patches.

## 2021-09-22 NOTE — Assessment & Plan Note (Signed)
Patient notes she has increased p.o. intake and is having lost less GI losses but continues to lose weight.  We will start with ultrasound to evaluate ovaries given right lower quadrant abdominal pain persisting.  If continue to lose weight we will get additional work-up.  Advised scheduling wellness for routine cancer screening.

## 2021-09-22 NOTE — Assessment & Plan Note (Signed)
Restarting Wellbutrin at 150 mg daily.  Update in 2 weeks for acute dose increase.  Return in 4 weeks for check-in.

## 2021-09-22 NOTE — Progress Notes (Signed)
Subjective:     Kathy Garcia is a 56 y.o. female presenting for Abdominal Pain (Constant lower right pain, ongoing for a year and a half ), Referral (OBGYN in [place]), and Weight Loss     Abdominal Pain   Patient with ongoing right lower quadrant abdominal pain for approximately 2+ years.  Not directly associated with chronic diarrhea and nausea and vomiting but she also has these symptoms.  She notes she has been trying to eat but has lost 13 pounds since October.  She is most concerned about the possibility of ovarian cancer given the location of the pain and the weight loss.  She had a CT abdomen done in June with normal reproductive organs and other than diverticulosis no other acute findings.  She saw Dr. Tobi Bastos with GI and had a reassuring colonoscopy was diagnosed with bile salt induced diarrhea.  And also treated for C. difficile.  She is taking Prevalite with improvement in diarrhea.  Endorses projectile vomiting on a few occasions but has not vomited in 3 weeks.  Vomiting and nausea improved with as needed Zofran.  She also notes a history of anxiety and depression and notes extreme anxiety now is interested in restarting medication.  Discussed that these could cause worsening abdominal pains in the short-term but she still would like to be on something.  Was previously on Wellbutrin.   She is also interested in smoking cessation.  She notes concern for endometriosis as possible diagnosis however has completed menopause and is no longer having cycles.  Pain is constant in the right lower quadrant and worse with any kind of physical activity.     Review of Systems  Gastrointestinal:  Positive for abdominal pain.    Social History   Tobacco Use  Smoking Status Every Day   Packs/day: 0.50   Years: 20.00   Pack years: 10.00   Types: Cigarettes  Smokeless Tobacco Never  Tobacco Comments   1 ppd x 20 years, quit for 10 years, restarted 2018         Objective:    BP Readings from Last 3 Encounters:  09/22/21 140/82  05/26/21 (!) 156/88  03/27/21 117/64   Wt Readings from Last 3 Encounters:  09/22/21 137 lb 9 oz (62.4 kg)  05/26/21 150 lb (68 kg)  03/27/21 155 lb (70.3 kg)    BP 140/82    Pulse 78    Temp 98.2 F (36.8 C) (Oral)    Ht 5' 3.5" (1.613 m)    Wt 137 lb 9 oz (62.4 kg)    LMP 12/01/2012 (Exact Date)    SpO2 99%    BMI 23.99 kg/m    Physical Exam Constitutional:      General: She is not in acute distress.    Appearance: She is well-developed. She is not diaphoretic.  HENT:     Right Ear: External ear normal.     Left Ear: External ear normal.     Nose: Nose normal.  Eyes:     Conjunctiva/sclera: Conjunctivae normal.  Cardiovascular:     Rate and Rhythm: Normal rate and regular rhythm.     Heart sounds: No murmur heard. Pulmonary:     Effort: Pulmonary effort is normal. No respiratory distress.     Breath sounds: Normal breath sounds. No wheezing.  Abdominal:     General: Abdomen is flat. Bowel sounds are normal. There is no distension.     Palpations: Abdomen is soft.  Tenderness: There is abdominal tenderness in the right lower quadrant and suprapubic area. There is guarding. There is no rebound.  Musculoskeletal:     Cervical back: Neck supple.  Skin:    General: Skin is warm and dry.     Capillary Refill: Capillary refill takes less than 2 seconds.  Neurological:     Mental Status: She is alert. Mental status is at baseline.  Psychiatric:        Mood and Affect: Mood normal.        Behavior: Behavior normal.          Assessment & Plan:   Problem List Items Addressed This Visit       Digestive   Chronic diarrhea   Relevant Medications   dicyclomine (BENTYL) 10 MG capsule   Nausea and vomiting    Rare use of Zofran but persistent daily nausea.  We will try Reglan and to see if she responds to this.  Update in 1 to 2 weeks      Relevant Medications   metoCLOPramide (REGLAN) 5  MG/5ML solution     Other   Generalized anxiety disorder    Patient with ongoing anxiety wanting to restart medications.  Reviewed med list she was on Wellbutrin in the past we will start Wellbutrin 150 mg once daily.  Update in 2 weeks if tolerating and ineffective will consider increase to 300 mg daily.  Return in 4 weeks for check      Relevant Medications   buPROPion (WELLBUTRIN XL) 150 MG 24 hr tablet   Major depressive disorder    Restarting Wellbutrin at 150 mg daily.  Update in 2 weeks for acute dose increase.  Return in 4 weeks for check-in.      Relevant Medications   buPROPion (WELLBUTRIN XL) 150 MG 24 hr tablet   Tobacco abuse    Starting Wellbutrin again for ongoing mood mood disorders.  Advised quitting smoking 2 weeks then.  If ineffective will consider nicotine replacement patches.      Weight loss    Patient notes she has increased p.o. intake and is having lost less GI losses but continues to lose weight.  We will start with ultrasound to evaluate ovaries given right lower quadrant abdominal pain persisting.  If continue to lose weight we will get additional work-up.  Advised scheduling wellness for routine cancer screening.      RLQ abdominal pain - Primary    Persistent constant right lower quadrant abdominal pain.  CT abdomen and colonoscopy reassuring.  Will get transvaginal ultrasound to evaluate ovaries more closely.  Discussed that if everything is normal with work-up would can would recommend PT consult for possible MSK related pain.  Patient with guarding on exam.  Patient cannot remember if she had good response to Bentyl.  She was taken off by GI but will try again to see if that helps with chronic abdominal pain.      Relevant Medications   dicyclomine (BENTYL) 10 MG capsule   Other Relevant Orders   US PELVIC COMPLETE WITH TRANSVAGINAL   Ambulatory referral to Obstetrics / Gynecology   Other Visit Diagnoses     Chronic abdominal pain       Relevant  Medications   buPROPion (WELLBUTRIN XL) 150 MG 24 hr tablet   dicyclomine (BENTYL) 10 MG capsule   Encounter for screening mammogram for malignant neoplasm of breast       Relevant Orders   MM Digital Screening  I spent >40 minutes with pt , obtaining history, examining, reviewing chart, documenting encounter and discussing the above plan of care.   Return in about 4 weeks (around 10/20/2021) for anxiety/depression/pain.  Lynnda Child, MD  This visit occurred during the SARS-CoV-2 public health emergency.  Safety protocols were in place, including screening questions prior to the visit, additional usage of staff PPE, and extensive cleaning of exam room while observing appropriate contact time as indicated for disinfecting solutions.

## 2021-09-22 NOTE — Assessment & Plan Note (Signed)
Patient with ongoing anxiety wanting to restart medications.  Reviewed med list she was on Wellbutrin in the past we will start Wellbutrin 150 mg once daily.  Update in 2 weeks if tolerating and ineffective will consider increase to 300 mg daily.  Return in 4 weeks for check

## 2021-09-22 NOTE — Assessment & Plan Note (Addendum)
Persistent constant right lower quadrant abdominal pain.  CT abdomen and colonoscopy reassuring.  Will get transvaginal ultrasound to evaluate ovaries more closely.  Discussed that if everything is normal with work-up would can would recommend PT consult for possible MSK related pain.  Patient with guarding on exam.  Patient cannot remember if she had good response to Bentyl.  She was taken off by GI but will try again to see if that helps with chronic abdominal pain.

## 2021-09-23 ENCOUNTER — Encounter: Payer: Self-pay | Admitting: *Deleted

## 2021-09-23 ENCOUNTER — Telehealth: Payer: Self-pay | Admitting: Family Medicine

## 2021-09-23 DIAGNOSIS — R634 Abnormal weight loss: Secondary | ICD-10-CM

## 2021-09-23 NOTE — Telephone Encounter (Signed)
Kathy Garcia called in and stated that she saw Dr. Einar Pheasant and she stated that she just about having hyperthyroid due to she said that she is tried, and exhausted and she was just thinking if it could be her thyroid and she is losing weight but it could be her thyroid. She did get her referral for the obgyn

## 2021-09-24 ENCOUNTER — Ambulatory Visit
Admission: RE | Admit: 2021-09-24 | Discharge: 2021-09-24 | Disposition: A | Discharge status: Home / Self Care | Payer: No Typology Code available for payment source | Source: Ambulatory Visit | Attending: Family Medicine | Admitting: Family Medicine

## 2021-09-24 ENCOUNTER — Telehealth: Payer: Self-pay | Admitting: Family Medicine

## 2021-09-24 DIAGNOSIS — R634 Abnormal weight loss: Secondary | ICD-10-CM

## 2021-09-24 NOTE — Telephone Encounter (Signed)
Called 3 numbers and finally got an answer from a female that said "yeah you called earlier" I told this person that I was trying to get in touch with Kathy Garcia and he stated that pt was sleeping. I requested that he give her the message to call [place] back. He said he would.  °

## 2021-09-24 NOTE — Telephone Encounter (Signed)
I do not prescribe xanax as a daily medication for long-term use.   If she does not want to start the medications she can make an appointment to discuss alternatives

## 2021-09-24 NOTE — Telephone Encounter (Signed)
Called 3 numbers and on 3rd attempt got her son.    Please call patient back if she does not call the office today. To provide the following - unclear if she views mychart so call patient.   Thank you for reaching out. During our visit and given the anxiety, smoking cessation, and abdominal symptoms I did not have time to fully think through the weight loss.   I'm surprised we haven't checked your thyroid before and this is a great idea.   I would also recommend the following as we have not check labs in several months  1) Liver, kidney, electrolytes 2) Blood counts 3) Thyroid  You have had hepatitis C and HIV checked within the last 2 years so I don't think these should be repeated.   I've also ordered a Chest X-ray - if you want to wait for the abdominal work-up and blood work prior to getting these I think that is reasonable. But since you do smoke I would recommend getting this.   Please schedule a lab appointment

## 2021-09-24 NOTE — Telephone Encounter (Signed)
Called 3 numbers and finally got an answer from a female that said "yeah you called earlier" I told this person that I was trying to get in touch with Kathy Garcia and he stated that pt was sleeping. I requested that he give her the message to call [place] back. He said he would.

## 2021-09-24 NOTE — Addendum Note (Signed)
Addended by: Lynnda Child on: 09/24/2021 01:49 PM   Modules accepted: Orders

## 2021-09-24 NOTE — Telephone Encounter (Signed)
[name] called in and stated that she got her medication yesterday and she was reading the side effects of metoCLOPramide (REGLAN) 5 MG/5ML solution and  buPROPion (WELLBUTRIN XL) 150 MG 24 hr tablet , wanted to know if she can change back to xanex she stated that she didn't have any side effects from it.

## 2021-09-30 ENCOUNTER — Ambulatory Visit: Payer: No Typology Code available for payment source | Admitting: Family Medicine

## 2021-10-20 ENCOUNTER — Encounter: Payer: Self-pay | Admitting: *Deleted

## 2021-10-22 ENCOUNTER — Ambulatory Visit: Payer: No Typology Code available for payment source | Admitting: Family Medicine

## 2021-10-23 ENCOUNTER — Other Ambulatory Visit (HOSPITAL_COMMUNITY)
Admission: RE | Admit: 2021-10-23 | Discharge: 2021-10-23 | Disposition: A | Discharge status: Home / Self Care | Payer: PRIVATE HEALTH INSURANCE | Source: Ambulatory Visit | Attending: Advanced Practice Midwife | Admitting: Advanced Practice Midwife

## 2021-10-23 ENCOUNTER — Encounter: Payer: Self-pay | Admitting: Advanced Practice Midwife

## 2021-10-23 ENCOUNTER — Other Ambulatory Visit: Payer: Self-pay

## 2021-10-23 ENCOUNTER — Telehealth: Payer: Self-pay

## 2021-10-23 ENCOUNTER — Ambulatory Visit (INDEPENDENT_AMBULATORY_CARE_PROVIDER_SITE_OTHER): Payer: No Typology Code available for payment source | Admitting: Advanced Practice Midwife

## 2021-10-23 VITALS — BP 106/69 | HR 85 | Ht 63.0 in | Wt 148.0 lb

## 2021-10-23 DIAGNOSIS — Z01419 Encounter for gynecological examination (general) (routine) without abnormal findings: Secondary | ICD-10-CM

## 2021-10-23 DIAGNOSIS — R634 Abnormal weight loss: Secondary | ICD-10-CM

## 2021-10-23 DIAGNOSIS — R109 Unspecified abdominal pain: Secondary | ICD-10-CM

## 2021-10-23 DIAGNOSIS — G609 Hereditary and idiopathic neuropathy, unspecified: Secondary | ICD-10-CM

## 2021-10-23 DIAGNOSIS — G8929 Other chronic pain: Secondary | ICD-10-CM

## 2021-10-23 NOTE — Progress Notes (Signed)
? ? ?GYNECOLOGY ANNUAL PREVENTATIVE CARE ENCOUNTER NOTE ? ?History:    ? Kathy Garcia is a 56 y.o. G2P2 female here for a routine annual gynecologic exam.  Current complaints: patient requests assistance with multiple chronic concerns including right periumbilical pain, onset about 18 months ago, unintentional weight loss, and peripheral neuropathy bilaterally in her fingers and toes.   Denies abnormal vaginal bleeding, discharge, pelvic pain, problems with intercourse or other gynecologic concerns.  ? ?Patient initially thought her abdominal pain was related to her diverticulosis. She has been evaluated and cleared by GI and reports a normal followup colonoscopy. ? ?Patient was previously evaluated by her former PCP in [place] who referred her to Physical Therapy for her peripheral neuropathy. She states she attended multiple sessions with PT but "all they did was apply ice packs".  ? ?Patient unaware that her PCP has already ordered the preventive labs she is requesting. They have also already ordered the mammogram and pelvic ultrasound she requests.  ? ?Patient aware a female relative has been answering her phone when her PCP attempts to contact her but states she was not aware she had labs pending scheduling. ?  ?Gynecologic History ?Patient's last menstrual period was 12/01/2012 (exact date). ?Contraception: tubal ligation, postmenopausal ?Last Pap: Remote "maybe 2015". Result was normal with negative HPV ?Last Mammogram: Remote, updated mammogram ordered by PCP.   ?Last Colonoscopy: 03/27/2021.  Result was extensive colonic diverticulosis. Patient reports subsequent normal colonoscopy but not visible in chart ? ?Obstetric History ?OB History  ?Gravida Para Term Preterm AB Living  ?2 2       2   ?SAB IAB Ectopic Multiple Live Births  ?           ?  ?# Outcome Date GA Lbr Len/2nd Weight Sex Delivery Anes PTL Lv  ?2 Para           ?1 Para           ? ? ?Past Medical History:  ?Diagnosis Date  ? Allergy   ?  Anxiety   ? COPD (chronic obstructive pulmonary disease) (HCC)   ? Depression   ? Gallstones and inflammation of gallbladder without obstruction 01/03/2013  ? GERD (gastroesophageal reflux disease)   ? ? ?Past Surgical History:  ?Procedure Laterality Date  ? CHOLECYSTECTOMY  2014  ? COLONOSCOPY WITH PROPOFOL N/A 03/27/2021  ? Procedure: COLONOSCOPY WITH PROPOFOL;  Surgeon: 03/29/2021, MD;  Location: [facility] ENDOSCOPY;  Service: Gastroenterology;  Laterality: N/A;  ? TUBAL LIGATION    ? ? ?Current Outpatient Medications on File Prior to Visit  ?Medication Sig Dispense Refill  ? buPROPion (WELLBUTRIN XL) 150 MG 24 hr tablet Take 1 tablet (150 mg total) by mouth daily. 30 tablet 1  ? cholestyramine light (PREVALITE) 4 g packet Take 1 packet (4 g total) by mouth 2 (two) times daily. 180 packet 4  ? ondansetron (ZOFRAN-ODT) 4 MG disintegrating tablet TAKE ONE TABLET EVERY EIGHT HOURS AS NEEDED FOR NAUSEA AND VOMITING 20 tablet 0  ? albuterol (VENTOLIN HFA) 108 (90 Base) MCG/ACT inhaler Inhale 2 puffs into the lungs every 4 (four) hours as needed for wheezing. (Patient not taking: Reported on 09/22/2021) 8 g 1  ? dicyclomine (BENTYL) 10 MG capsule Take 1 capsule (10 mg total) by mouth 3 (three) times daily before meals. (Patient not taking: Reported on 10/23/2021) 90 capsule 0  ? metoCLOPramide (REGLAN) 5 MG/5ML solution Take 5 mLs (5 mg total) by mouth 2 (two) times daily with a meal. (  Patient not taking: Reported on 10/23/2021) 60 mL 1  ? ?No current facility-administered medications on file prior to visit.  ? ? ?Allergies  ?Allergen Reactions  ? Hydrocodone Nausea And Vomiting  ? ? ?Social History:  reports that she has been smoking cigarettes. She has a 10.00 pack-year smoking history. She has never used smokeless tobacco. She reports that she does not currently use alcohol. She reports that she does not use drugs. ? ?Family History  ?Problem Relation Age of Onset  ? Hypertension Mother   ? Alzheimer's disease Mother   ?  Diverticulitis Mother   ? Cancer Father   ?     unknown type  ? Colon cancer Maternal Aunt   ? Heart failure Maternal Uncle   ? Stomach cancer Paternal Aunt   ? Breast cancer Maternal Aunt   ? Brain cancer Paternal Aunt   ? Breast cancer Paternal Aunt   ? ? ?The following portions of the patient's history were reviewed and updated as appropriate: allergies, current medications, past family history, past medical history, past social history, past surgical history and problem list. ? ?Review of Systems ?Pertinent items noted in HPI and remainder of comprehensive ROS otherwise negative. ? ?Physical Exam:  ?BP 106/69   Pulse 85   Ht 5\' 3"  (1.6 m)   Wt 148 lb (67.1 kg)   LMP 12/01/2012 (Exact Date)   BMI 26.22 kg/m?  ?CONSTITUTIONAL: Well-developed, well-nourished female in no acute distress.  ?HENT:  Normocephalic, atraumatic, External right and left ear normal.  ?EYES: Conjunctivae and EOM are normal. Pupils are equal, round, and reactive to light. No scleral icterus.  ?NECK: Normal range of motion, supple, no masses.  Normal thyroid.  ?SKIN: Skin is warm and dry. No rash noted. Not diaphoretic. No erythema. No pallor. ?MUSCULOSKELETAL: Normal range of motion. No tenderness.  No cyanosis, clubbing, or edema. ?NEUROLOGIC: Alert and oriented to person, place, and time. Normal reflexes, muscle tone coordination.  ?PSYCHIATRIC: Normal mood and affect. Normal behavior. Normal judgment and thought content. ?CARDIOVASCULAR: Normal heart rate noted, regular rhythm ?RESPIRATORY: Clear to auscultation bilaterally. Effort and breath sounds normal, no problems with respiration noted. ?BREASTS: Symmetric in size. No masses, tenderness, skin changes, nipple drainage, or lymphadenopathy bilaterally. Performed in the presence of a chaperone. ?ABDOMEN: Soft, no distention noted.  No tenderness, rebound or guarding.  ?PELVIC: Normal appearing external genitalia and urethral meatus; normal appearing vaginal mucosa and cervix.  No  abnormal vaginal discharge noted.  Pap smear obtained.  Performed in the presence of a chaperone. ?  ?Assessment and Plan:  ?  1. Well woman exam with routine gynecological exam ?- No abnormal findings on physical exam ?- Cytology - PAP ?- CBC ?- TSH ?- Hemoglobin A1c ?- Comprehensive metabolic panel ? ?2. Chronic abdominal pain ?- Abdomen non-tender ?- US PELVIC COMPLETE WITH TRANSVAGINAL; Future ? ?3. Unintentional weight loss ?- Pending lab results ? ?4. Idiopathic peripheral neuropathy ?- Chronic, advised restarting physical therapy then discussing other interventions with PCP ?- No impact on grip strength, ability to perform ADLs ?- Ambulatory referral to Physical Therapy ? ? ?Four phone numbers currently listed in patient chart. Patient names alternate phone number and cannot identify or recite the numbers currently listed. Contact information updated ? ? ?Will follow up results of pap smear and labs and manage accordingly. ?Mammogram ordered by PCP ?Routine preventative health maintenance measures emphasized. ?Please refer to After Visit Summary for other counseling recommendations.  ?   ?Total visit time: 275  min. Greater than 50% of visit spent in counseling and coordination of care ? ?Clayton Bibles, MSA, MSN, CNM ?Certified Nurse Midwife, Faculty Practice ?Center for [employer], [facility] ? ? ?

## 2021-10-23 NOTE — Progress Notes (Signed)
Abdominal pain for 2 years, PCP ruled out GI issues, hasn't seen a gyn since 2015 ?

## 2021-10-23 NOTE — Telephone Encounter (Signed)
Called pt to notify her of her ultrasound scheduled on 10/30/21 at 2:30. Left vm for pt to call the office back with any questions.  ?

## 2021-10-24 ENCOUNTER — Telehealth: Payer: Self-pay | Admitting: Advanced Practice Midwife

## 2021-10-24 LAB — CBC
Hematocrit: 36.3 % (ref 34.0–46.6)
Hemoglobin: 11.9 g/dL (ref 11.1–15.9)
MCH: 33.1 pg — ABNORMAL HIGH (ref 26.6–33.0)
MCHC: 32.8 g/dL (ref 31.5–35.7)
MCV: 101 fL — ABNORMAL HIGH (ref 79–97)
Platelets: 192 10*3/uL (ref 150–450)
RBC: 3.59 x10E6/uL — ABNORMAL LOW (ref 3.77–5.28)
RDW: 11.8 % (ref 11.7–15.4)
WBC: 7.4 10*3/uL (ref 3.4–10.8)

## 2021-10-24 LAB — COMPREHENSIVE METABOLIC PANEL
ALT: 16 IU/L (ref 0–32)
AST: 17 IU/L (ref 0–40)
Albumin/Globulin Ratio: 2.1 (ref 1.2–2.2)
Albumin: 4.6 g/dL (ref 3.8–4.9)
Alkaline Phosphatase: 99 IU/L (ref 44–121)
BUN/Creatinine Ratio: 26 — ABNORMAL HIGH (ref 9–23)
BUN: 20 mg/dL (ref 6–24)
Bilirubin Total: <0.2 mg/dL (ref 0.0–1.2)
CO2: 25 mmol/L (ref 20–29)
Calcium: 9 mg/dL (ref 8.7–10.2)
Chloride: 103 mmol/L (ref 96–106)
Creatinine, Ser: 0.78 mg/dL (ref 0.57–1.00)
Globulin, Total: 2.2 g/dL (ref 1.5–4.5)
Glucose: 68 mg/dL — ABNORMAL LOW (ref 70–99)
Potassium: 4.4 mmol/L (ref 3.5–5.2)
Sodium: 140 mmol/L (ref 134–144)
Total Protein: 6.8 g/dL (ref 6.0–8.5)
eGFR: 89 mL/min/{1.73_m2} (ref >59)

## 2021-10-24 LAB — TSH: TSH: 1.47 u[IU]/mL (ref 0.450–4.500)

## 2021-10-24 LAB — HEMOGLOBIN A1C
Est. average glucose Bld gHb Est-mCnc: 103 mg/dL
Hgb A1c MFr Bld: 5.2 % (ref 4.8–5.6)

## 2021-10-24 NOTE — Telephone Encounter (Signed)
Attempted to call patient to discuss lab results as she is not sure she can access her MyChart account. VLTCB. ? ?Mallie Snooks, [place], MSN, CNM ?Certified Nurse Midwife, [place] ?Center for [place] ? ? ?

## 2021-10-28 LAB — CYTOLOGY - PAP
Comment: NEGATIVE
Comment: NEGATIVE
Comment: NEGATIVE
Diagnosis: UNDETERMINED — AB
HPV 16: NEGATIVE
HPV 18 / 45: POSITIVE — AB
High risk HPV: POSITIVE — AB

## 2021-10-29 ENCOUNTER — Telehealth: Payer: Self-pay | Admitting: Advanced Practice Midwife

## 2021-10-29 NOTE — Telephone Encounter (Signed)
Attempted to reach patient to discuss pap smear results. VLTCB ? ? ?Clayton Bibles, MSA, MSN, CNM ?Certified Nurse Midwife, Faculty Practice ?[facility] for [employer], [facility] ? ?

## 2021-10-30 ENCOUNTER — Ambulatory Visit: Payer: No Typology Code available for payment source

## 2021-11-04 ENCOUNTER — Telehealth: Payer: Self-pay

## 2021-11-04 NOTE — Telephone Encounter (Signed)
Pt reached, call transferred from front, two identifiers used. ? ?Reviewed pap smear results with pt, answered questions, transferred call back to front to schedule for colposcopy. Pt confirmed understanding. ?

## 2021-11-05 ENCOUNTER — Ambulatory Visit: Payer: No Typology Code available for payment source

## 2021-11-06 ENCOUNTER — Ambulatory Visit: Payer: No Typology Code available for payment source | Admitting: Advanced Practice Midwife

## 2021-11-11 ENCOUNTER — Other Ambulatory Visit: Payer: Self-pay | Admitting: *Deleted

## 2021-11-11 DIAGNOSIS — G8929 Other chronic pain: Secondary | ICD-10-CM

## 2021-11-12 ENCOUNTER — Encounter: Payer: Self-pay | Admitting: Obstetrics and Gynecology

## 2021-11-12 ENCOUNTER — Ambulatory Visit
Admission: RE | Admit: 2021-11-12 | Discharge: 2021-11-12 | Disposition: A | Discharge status: Home / Self Care | Payer: PRIVATE HEALTH INSURANCE | Source: Ambulatory Visit | Attending: Advanced Practice Midwife | Admitting: Advanced Practice Midwife

## 2021-11-12 ENCOUNTER — Ambulatory Visit
Admission: RE | Admit: 2021-11-12 | Discharge: 2021-11-12 | Disposition: A | Discharge status: Home / Self Care | Payer: PRIVATE HEALTH INSURANCE | Source: Ambulatory Visit

## 2021-11-12 ENCOUNTER — Ambulatory Visit (INDEPENDENT_AMBULATORY_CARE_PROVIDER_SITE_OTHER): Payer: No Typology Code available for payment source | Admitting: Obstetrics and Gynecology

## 2021-11-12 ENCOUNTER — Other Ambulatory Visit: Payer: Self-pay | Admitting: Obstetrics and Gynecology

## 2021-11-12 VITALS — BP 128/75 | HR 96

## 2021-11-12 DIAGNOSIS — R935 Abnormal findings on diagnostic imaging of other abdominal regions, including retroperitoneum: Secondary | ICD-10-CM | POA: Diagnosis not present

## 2021-11-12 DIAGNOSIS — G8929 Other chronic pain: Secondary | ICD-10-CM | POA: Insufficient documentation

## 2021-11-12 DIAGNOSIS — N841 Polyp of cervix uteri: Secondary | ICD-10-CM | POA: Diagnosis not present

## 2021-11-12 DIAGNOSIS — R109 Unspecified abdominal pain: Secondary | ICD-10-CM | POA: Insufficient documentation

## 2021-11-12 DIAGNOSIS — R87610 Atypical squamous cells of undetermined significance on cytologic smear of cervix (ASC-US): Secondary | ICD-10-CM | POA: Diagnosis not present

## 2021-11-12 DIAGNOSIS — R1032 Left lower quadrant pain: Secondary | ICD-10-CM | POA: Diagnosis not present

## 2021-11-12 DIAGNOSIS — R87810 Cervical high risk human papillomavirus (HPV) DNA test positive: Secondary | ICD-10-CM

## 2021-11-12 DIAGNOSIS — N879 Dysplasia of cervix uteri, unspecified: Secondary | ICD-10-CM

## 2021-11-12 HISTORY — PX: PR ENDOMETRIAL BX CONJUNCT W/COLPOSCOPY: 58110

## 2021-11-12 NOTE — Progress Notes (Signed)
Obstetrics and Gynecology ?New Patient Evaluation ? ?Appointment Date: 11/12/2021 ? ?OBGYN Clinic: [facility] ? ?Primary Care Provider: Lynnda Child ? ?Chief Complaint:  ?Chief Complaint  ?Patient presents with  ? Colposcopy  ? ? ?History of Present Illness: Kathy Garcia is a 56 y.o. Caucasian G2P2 (Patient's last menstrual period was 12/01/2012 (exact date).), seen for the above chief complaint. ?Long h/o abnormal pap smears and cryo ? ?Patient had an annual gyn exam on 3/23 with [facility] and had a pap smear that showed ASCUS/HPV 18,45+.  She also endorsed chronic LLQ pain and had a pelvic and abdominal u/s ordered. She has been seen by GI and had a colonoscopy late last year that showed diverticulosis. ? ?Abdominal u/s results still pending but pelvic u/s results showed ?calcification in the uterine cavity with 73mm endometrial stripe. (see below).  ? ?Patient states she's had LLQ discomfort for about a year to year and half ? ?Review of Systems: Pertinent items noted in HPI and remainder of comprehensive ROS otherwise negative.  ? ?Patient Active Problem List  ? Diagnosis Date Noted  ? RLQ abdominal pain 09/22/2021  ? Nausea and vomiting 09/22/2021  ? Skin lesions 03/03/2021  ? Chronic neck pain 01/15/2021  ? Tobacco abuse 01/15/2021  ? Weight loss 01/15/2021  ? Generalized anxiety disorder 06/13/2020  ? COPD (chronic obstructive pulmonary disease) (HCC) 06/13/2020  ? Essential hypertension 06/13/2020  ? Migraines 06/13/2020  ? Major depressive disorder 06/13/2020  ? GERD (gastroesophageal reflux disease) 01/03/2014  ? Chronic diarrhea 01/03/2014  ? Abdominal pain, right upper quadrant 01/28/2013  ? ? ? ?Past Medical History:  ?Past Medical History:  ?Diagnosis Date  ? Allergy   ? Anxiety   ? COPD (chronic obstructive pulmonary disease) (HCC)   ? Depression   ? Diverticulosis   ? Gallstones and inflammation of gallbladder without obstruction 01/03/2013  ? GERD  (gastroesophageal reflux disease)   ? Vaginal Pap smear, abnormal   ? ? ?Past Surgical History:  ?Past Surgical History:  ?Procedure Laterality Date  ? CERVIX LESION DESTRUCTION  1995  ? CHOLECYSTECTOMY  08/03/2012  ? COLONOSCOPY WITH PROPOFOL N/A 03/27/2021  ? Procedure: COLONOSCOPY WITH PROPOFOL;  Surgeon: Wyline Mood, MD;  Location: [facility] ENDOSCOPY;  Service: Gastroenterology;  Laterality: N/A;  ? PR ENDOMET BIOPSY DONE W/COLPOSCOPY  11/12/2021  ? TUBAL LIGATION    ? ? ?Past Obstetrical History:  ?OB History  ?Gravida Para Term Preterm AB Living  ?2 2       2   ?SAB IAB Ectopic Multiple Live Births  ?           ?  ?# Outcome Date GA Lbr Len/2nd Weight Sex Delivery Anes PTL Lv  ?2 Para           ?1 Para           ? ? ?Past Gynecological History: As per HPI. ?LMP: patient is postmenopausal ? ?Social History:  ?Social History  ? ?Socioeconomic History  ? Marital status: Married  ?  Spouse name:  ? Number of children: 2  ? Years of education: some college  ? Highest education level: Not on file  ?Occupational History  ? Not on file  ?Tobacco Use  ? Smoking status: Every Day  ?  Packs/day: 0.50  ?  Years: 20.00  ?  Pack years: 10.00  ?  Types: Cigarettes  ? Smokeless tobacco: Never  ? Tobacco comments:  ?  1 ppd x 20  years, quit for 10 years, restarted 2018  ?Vaping Use  ? Vaping Use: Never used  ?Substance and Sexual Activity  ? Alcohol use: Not Currently  ?  Comment: none since 2018  ? Drug use: No  ? Sexual activity: Yes  ?  Birth control/protection: Post-menopausal, Surgical  ?Other Topics Concern  ? Not on file  ?Social History Narrative  ? 06/13/20  ? From: the area  ? Living: with husband Sheria Lang (2019), with husband's cousin walter  ? 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   ? Family: 2 Susanne and Josetta Huddle - 3 grandchildren  ?   ? Enjoys: writing, drawing, playing with cats, time with grandchildren  ?   ? Exercise: not currently  ? Diet: nausea impacts diet  ?   ? Safety  ? Seat  belts: Yes   ? Guns: Yes  and secure  ? Safe in relationships: Yes   ? ?Social Determinants of Health  ? ?Financial Resource Strain: Not on file  ?Food Insecurity: Not on file  ?Transportation Needs: Not on file  ?Physical Activity: Not on file  ?Stress: Not on file  ?Social Connections: Not on file  ?Intimate Partner Violence: Not on file  ? ? ?Family History:  ?Family History  ?Problem Relation Age of Onset  ? Hypertension Mother   ? Alzheimer's disease Mother   ? Diverticulitis Mother   ? Cancer Father   ?     unknown type  ? Colon cancer Maternal Aunt   ? Heart failure Maternal Uncle   ? Stomach cancer Paternal Aunt   ? Breast cancer Maternal Aunt   ? Brain cancer Paternal Aunt   ? Breast cancer Paternal Aunt   ? ? ?Medications ?[name] had no medications administered during this visit. ?Current Outpatient Medications  ?Medication Sig Dispense Refill  ? buPROPion (WELLBUTRIN XL) 150 MG 24 hr tablet Take 1 tablet (150 mg total) by mouth daily. 30 tablet 1  ? cholestyramine light (PREVALITE) 4 g packet Take 1 packet (4 g total) by mouth 2 (two) times daily. 180 packet 4  ? ondansetron (ZOFRAN-ODT) 4 MG disintegrating tablet TAKE ONE TABLET EVERY EIGHT HOURS AS NEEDED FOR NAUSEA AND VOMITING 20 tablet 0  ? albuterol (VENTOLIN HFA) 108 (90 Base) MCG/ACT inhaler Inhale 2 puffs into the lungs every 4 (four) hours as needed for wheezing. (Patient not taking: Reported on 09/22/2021) 8 g 1  ? dicyclomine (BENTYL) 10 MG capsule Take 1 capsule (10 mg total) by mouth 3 (three) times daily before meals. (Patient not taking: Reported on 10/23/2021) 90 capsule 0  ? metoCLOPramide (REGLAN) 5 MG/5ML solution Take 5 mLs (5 mg total) by mouth 2 (two) times daily with a meal. (Patient not taking: Reported on 10/23/2021) 60 mL 1  ? ?No current facility-administered medications for this visit.  ? ? ?Allergies ?Hydrocodone ? ? ?Physical Exam:  ?BP 128/75   Pulse 96   LMP 12/01/2012 (Exact Date)  There is no height or weight  on file to calculate BMI. ? ?General appearance: Well nourished, well developed female in no acute distress.  ?Respiratory:  Normal respiratory effort ?Abdomen: no masses, hernias; diffusely non tender to palpation, non distended ?Neuro/Psych:  Normal mood and affect.  ?Skin:  Warm and dry.  ?Lymphatic:  No inguinal lymphadenopathy.  ? ?Pelvic exam: is not limited by body habitus ?EGBUS: within normal limits ?Vagina: within normal limits and with no blood or  discharge in the vault ?Cervix: normal appearing cervix without tenderness, discharge or lesions. ?Uterus:  nonenlarged and non tender ?Adnexa:  normal adnexa and no mass, fullness, tenderness ?Rectovaginal: deferred ? ?See procedure note for colpo, ecc and embx ? ?Laboratory: none ? ?Radiology:  ?Narrative & Impression  ?CLINICAL DATA:  Chronic RIGHT-side abdominal pain, post menopausal ?  ?EXAM: ?TRANSABDOMINAL AND TRANSVAGINAL ULTRASOUND OF PELVIS ?  ?TECHNIQUE: ?Both transabdominal and transvaginal ultrasound examinations of the ?pelvis were performed. Transabdominal technique was performed for ?global imaging of the pelvis including uterus, ovaries, adnexal ?regions, and pelvic cul-de-sac. It was necessary to proceed with ?endovaginal exam following the transabdominal exam to visualize the ?uterus, endometrium, and ovaries. ?  ?COMPARISON:  None ?  ?FINDINGS: ?Uterus ?  ?Measurements: 6.0 x 2.8 x 4.6 cm = volume: 40 mL. Anteverted. Normal ?morphology without mass ?  ?Endometrium ?  ?Thickness: 7 mm. Echogenic focus within upper uterine endometrial ?complex likely small calcification 3 mm diameter. No discrete ?endometrial fluid or mass ?  ?Right ovary ?  ?Measurements: 1.8 x 0.8 x 1.9 cm = volume: 1 mL. Normal morphology ?without mass ?  ?Left ovary ?  ?Measurements: 1.4 x 1.1 x 1.0 cm = volume: 0.8 mL. Normal morphology ?without mass ?  ?Other findings ?  ?Small amount of nonspecific free pelvic fluid.  No adnexal masses. ?  ?IMPRESSION: ?7 mm thick  endometrial complex, abnormal for a postmenopausal ?patient with bleeding; in the setting of post-menopausal bleeding, ?endometrial sampling is indicated to exclude carcinoma. If results ?are benign, sonohysterogram shoul

## 2021-11-12 NOTE — Procedures (Signed)
Colposcopy and Endometrial Biopsy Procedure Note ? ?Pre-operative Diagnosis:  ?10/23/2021 pap: ASCUS/HPV 18/45+ ?11/12/21 ultrasound for chronic pelvic pain: Thickness: 7 mm. Echogenic focus within upper uterine endometrial complex likely small calcification 3 mm diameter. No discrete endometrial fluid or mass ? ?Post-operative Diagnosis: Same. Endocervical polyp ? ?Procedure Details  ?Last menstrual period: 2015 ?The risks (including infection, bleeding, pain) and benefits of the procedure were explained to the patient and written informed consent was obtained. ? ?The patient was placed in the dorsal lithotomy position. A Pederson was speculum inserted in the vagina, and the cervix was visualized.  Acetic acid staining was done and the cervix was viewed with green filter; lugol's staining with green filter was also done . ? ?Biopsy from 12, 3, 6, 9 o'clock and cervical polyp and then single toothed tenaculum applied and endocervical curettage in all four quadrants done.  ? ?The cervix was then prepped with povidone iodine.  A pipelle was inserted into the uterine cavity and sounded the uterus to a depth of 6.5cm.  A Minimal amount of tissue was collected after 2 passes. The sample was sent for pathologic examination. ? ?There was no bleeding after procedure with application of silver nitrate.  ? ?Findings: benign appearing cervical. Benign appearing 45mm likely endocervical polyp vs nabothian cyst coming from the posterior lip ? ?Adequate: No ? ?Specimens: 12, 3, 6, 9 o'clock (sent together) and cervical polyp, and endocervical polyp. Endometrial biopsy ? ?Condition: Stable ? ?Complications: None ? ?Plan: ?The patient was advised to call for any fever or for prolonged or severe pain or bleeding. She was advised to use OTC analgesics as needed for mild to moderate pain. She was advised to do pelvic rest x 1 week ? ?Cornelia Copa MD ?Attending ?[facility]) ? ? ? ?

## 2021-11-14 DIAGNOSIS — N879 Dysplasia of cervix uteri, unspecified: Secondary | ICD-10-CM | POA: Insufficient documentation

## 2021-11-14 DIAGNOSIS — N871 Moderate cervical dysplasia: Secondary | ICD-10-CM | POA: Insufficient documentation

## 2021-11-14 DIAGNOSIS — R935 Abnormal findings on diagnostic imaging of other abdominal regions, including retroperitoneum: Secondary | ICD-10-CM | POA: Insufficient documentation

## 2021-11-17 ENCOUNTER — Telehealth: Payer: Self-pay

## 2021-11-17 NOTE — Telephone Encounter (Signed)
Return call to pt @ 10:21am regarding bleeding following procedures  ?Pt not ava LVM for pt to contact the office back.  ? ?

## 2021-11-26 ENCOUNTER — Ambulatory Visit (INDEPENDENT_AMBULATORY_CARE_PROVIDER_SITE_OTHER): Payer: No Typology Code available for payment source | Admitting: Family Medicine

## 2021-11-26 ENCOUNTER — Encounter: Payer: Self-pay | Admitting: Family Medicine

## 2021-11-26 DIAGNOSIS — N871 Moderate cervical dysplasia: Secondary | ICD-10-CM

## 2021-11-26 NOTE — Progress Notes (Signed)
? ?  Subjective:  ? ? Patient ID: Kathy Garcia is a 56 y.o. female presenting with Follow-up ? on 11/26/2021 ? ?HPI: ?Patient is s/p Endocervical poly p removal EMB and colpo for ASCUS with + HPV, + HPV 18,45. ?EMB and cervical polyp are WNL. ?Colpo shows cervical biopsy CIN1 and ECC CIN2. ?Patient had a lot of trouble with the in office procedure. ? ?Review of Systems  ?Constitutional:  Negative for chills and fever.  ?Respiratory:  Negative for shortness of breath.   ?Cardiovascular:  Negative for chest pain.  ?Gastrointestinal:  Negative for abdominal pain, nausea and vomiting.  ?Genitourinary:  Negative for dysuria.  ?Skin:  Negative for rash.  ?   ?Objective:  ?  ?BP 124/80   Pulse 68   LMP 12/01/2012 (Exact Date)  ?Physical Exam ?Exam conducted with a chaperone present.  ?Constitutional:   ?   General: She is not in acute distress. ?   Appearance: She is well-developed.  ?HENT:  ?   Head: Normocephalic and atraumatic.  ?Eyes:  ?   General: No scleral icterus. ?Cardiovascular:  ?   Rate and Rhythm: Normal rate.  ?Pulmonary:  ?   Effort: Pulmonary effort is normal.  ?Abdominal:  ?   Palpations: Abdomen is soft.  ?Musculoskeletal:  ?   Cervical back: Neck supple.  ?Skin: ?   General: Skin is warm and dry.  ?Neurological:  ?   Mental Status: She is alert and oriented to person, place, and time.  ? ? ? ?   ?Assessment & Plan:  ? ?Problem List Items Addressed This Visit   ? ?  ? Unprioritized  ? Dysplasia of cervix, high grade CIN 2  ?  Offered and patient declined in office LEEP--will schedule for [facility]. Risks include but are not limited to bleeding, infection, injury to surrounding structures, including bowel, bladder and ureters, blood clots, and death.  Likelihood of success is high. ? ? ?  ?  ? ? ?Return in about 3 months (around 02/25/2022) for postop check. ? ?[name], MD ?11/26/2021 ?9:34 AM ? ? ? ?

## 2021-11-26 NOTE — Assessment & Plan Note (Signed)
Offered and patient declined in office LEEP--will schedule for [facility]. Risks include but are not limited to bleeding, infection, injury to surrounding structures, including bowel, bladder and ureters, blood clots, and death.  Likelihood of success is high. ? ?

## 2021-11-26 NOTE — Progress Notes (Signed)
Here to discuss results  ?

## 2021-12-11 ENCOUNTER — Encounter (HOSPITAL_BASED_OUTPATIENT_CLINIC_OR_DEPARTMENT_OTHER): Payer: Self-pay | Admitting: Family Medicine

## 2021-12-15 ENCOUNTER — Other Ambulatory Visit: Payer: Self-pay

## 2021-12-15 ENCOUNTER — Encounter (HOSPITAL_BASED_OUTPATIENT_CLINIC_OR_DEPARTMENT_OTHER): Payer: Self-pay | Admitting: Family Medicine

## 2021-12-15 NOTE — Progress Notes (Signed)
Spoke w/ via phone for pre-op interview--- pt ?Lab needs dos----   cbc            ?Lab results------cn ?COVID test -----patient states asymptomatic no test needed ?Arrive at ------- 1015 on 12-16-2021 ?NPO after MN NO Solid Food.  Clear liquids from MN until--- 0915 ?Med rec completed ?Medications to take morning of surgery ----- none ?Diabetic medication ----- n/a ?Patient instructed no nail polish to be worn day of surgery ?Patient instructed to bring photo id and insurance card day of surgery ?Patient aware to have Driver (ride ) / caregiver for 24 hours after surgery -- husband, cameron ?Patient Special Instructions ----- n/a ?Pre-Op special Istructions ----- n/a ?Patient verbalized understanding of instructions that were given at this phone interview. ?Patient denies shortness of breath, chest pain, fever, cough at this phone interview.  ?

## 2021-12-15 NOTE — H&P (Signed)
Kathy Garcia is an 56 y.o. G2P2 female.   ?Chief Complaint: abnormal pap ?HPI: Patient is s/p Endocervical poly p removal EMB and colpo for ASCUS with + HPV, + HPV 18,45. ?EMB and cervical polyp are WNL. ?Colpo shows cervical biopsy CIN1 and ECC CIN2. ?Patient had a lot of trouble with the in office procedure. ? ?Past Medical History:  ?Diagnosis Date  ? Anemia   ? Anxiety   ? Arthritis   ? Asthma   ? Cervical dysplasia   ? Chronic abdominal pain   ? Chronic diarrhea   ? takes prevalite  ? Depression   ? Diverticulosis   ? GERD (gastroesophageal reflux disease)   ? Seasonal allergies   ? Wears glasses   ? ? ?Past Surgical History:  ?Procedure Laterality Date  ? [place]  ? CHOLECYSTECTOMY, LAPAROSCOPIC  01/04/2013  ? @[facility]   ? COLONOSCOPY WITH PROPOFOL N/A 03/27/2021  ? Procedure: COLONOSCOPY WITH PROPOFOL;  Surgeon: Jonathon Bellows, MD;  Location: [facility] ENDOSCOPY;  Service: Gastroenterology;  Laterality: N/A;  ? PR ENDOMET BIOPSY DONE W/COLPOSCOPY  11/12/2021  ? TONSILLECTOMY  1972  ? TUBAL LIGATION Bilateral 1996  ? ? ?Family History  ?Problem Relation Age of Onset  ? Hypertension Mother   ? Alzheimer's disease Mother   ? Diverticulitis Mother   ? Cancer Father   ?     unknown type  ? Colon cancer Maternal Aunt   ? Heart failure Maternal Uncle   ? Stomach cancer Paternal Aunt   ? Breast cancer Maternal Aunt   ? Brain cancer Paternal Aunt   ? Breast cancer Paternal Aunt   ? ?Social History:  reports that she has been smoking cigarettes. She has a 12.50 pack-year smoking history. She has never used smokeless tobacco. She reports that she does not currently use alcohol. She reports that she does not use drugs. ? ?Allergies:  ?Allergies  ?Allergen Reactions  ? Hydrocodone Nausea And Vomiting  ? ? ?No medications prior to admission.  ? ? ?Pertinent items are noted in HPI. ? ?Height 5\' 4"  (1.626 m), weight 61.7 kg, last menstrual period 12/01/2012. ?General appearance: alert, cooperative, and  appears stated age ?Head: Normocephalic, without obvious abnormality, atraumatic ?Neck: supple, symmetrical, trachea midline ?Lungs:  normal effort ?Heart: regular rate and rhythm ?Abdomen: soft, non-tender; bowel sounds normal; no masses,  no organomegaly ?Extremities: extremities normal, atraumatic, no cyanosis or edema ?Skin: Skin color, texture, turgor normal. No rashes or lesions ?Neurologic: Grossly normal ? ? ?Lab Results  ?Component Value Date  ? WBC 7.4 10/23/2021  ? HGB 11.9 10/23/2021  ? HCT 36.3 10/23/2021  ? MCV 101 (H) 10/23/2021  ? PLT 192 10/23/2021  ? ?No results found for: PREGTESTUR, PREGSERUM, HCG, HCGQUANT ? ? ?Assessment/Plan ?Principal Problem: ?  Dysplasia of cervix, high grade CIN 2 ?Active Problems: ?  Abnormal ultrasound of uterus ? ?For [facility] ?Risks include but are not limited to bleeding, infection, injury to surrounding structures, including bowel, bladder and ureters, blood clots, and death.  Likelihood of success is high. ? ? ? ?[name] ?12/15/2021, 5:59 PM ? ? ? ? ?

## 2021-12-16 ENCOUNTER — Encounter (HOSPITAL_BASED_OUTPATIENT_CLINIC_OR_DEPARTMENT_OTHER): Payer: Self-pay | Admitting: Family Medicine

## 2021-12-16 ENCOUNTER — Ambulatory Visit (HOSPITAL_BASED_OUTPATIENT_CLINIC_OR_DEPARTMENT_OTHER): Payer: 59 | Admitting: Anesthesiology

## 2021-12-16 ENCOUNTER — Encounter (HOSPITAL_BASED_OUTPATIENT_CLINIC_OR_DEPARTMENT_OTHER)
Admission: RE | Disposition: A | Discharge status: Home / Self Care | Payer: Self-pay | Source: Home / Self Care | Attending: Family Medicine

## 2021-12-16 ENCOUNTER — Ambulatory Visit (HOSPITAL_BASED_OUTPATIENT_CLINIC_OR_DEPARTMENT_OTHER)
Admission: RE | Admit: 2021-12-16 | Discharge: 2021-12-16 | Disposition: A | Discharge status: Home / Self Care | Payer: 59 | Attending: Family Medicine | Admitting: Family Medicine

## 2021-12-16 ENCOUNTER — Other Ambulatory Visit: Payer: Self-pay

## 2021-12-16 DIAGNOSIS — N841 Polyp of cervix uteri: Secondary | ICD-10-CM | POA: Diagnosis not present

## 2021-12-16 DIAGNOSIS — Z01818 Encounter for other preprocedural examination: Secondary | ICD-10-CM

## 2021-12-16 DIAGNOSIS — F17200 Nicotine dependence, unspecified, uncomplicated: Secondary | ICD-10-CM | POA: Insufficient documentation

## 2021-12-16 DIAGNOSIS — R935 Abnormal findings on diagnostic imaging of other abdominal regions, including retroperitoneum: Secondary | ICD-10-CM | POA: Diagnosis present

## 2021-12-16 DIAGNOSIS — J449 Chronic obstructive pulmonary disease, unspecified: Secondary | ICD-10-CM | POA: Diagnosis not present

## 2021-12-16 DIAGNOSIS — N870 Mild cervical dysplasia: Secondary | ICD-10-CM | POA: Diagnosis not present

## 2021-12-16 DIAGNOSIS — N871 Moderate cervical dysplasia: Secondary | ICD-10-CM

## 2021-12-16 HISTORY — DX: Dysplasia of cervix uteri, unspecified: N87.9

## 2021-12-16 HISTORY — DX: Other seasonal allergic rhinitis: J30.2

## 2021-12-16 HISTORY — DX: Anemia, unspecified: D64.9

## 2021-12-16 HISTORY — DX: Unspecified osteoarthritis, unspecified site: M19.90

## 2021-12-16 HISTORY — DX: Unspecified asthma, uncomplicated: J45.909

## 2021-12-16 HISTORY — DX: Other chronic pain: G89.29

## 2021-12-16 HISTORY — DX: Noninfective gastroenteritis and colitis, unspecified: K52.9

## 2021-12-16 HISTORY — PX: CERVICAL CONIZATION W/BX: SHX1330

## 2021-12-16 HISTORY — DX: Presence of spectacles and contact lenses: Z97.3

## 2021-12-16 LAB — CBC
HCT: 35.5 % — ABNORMAL LOW (ref 36.0–46.0)
Hemoglobin: 11.4 g/dL — ABNORMAL LOW (ref 12.0–15.0)
MCH: 33.2 pg (ref 26.0–34.0)
MCHC: 32.1 g/dL (ref 30.0–36.0)
MCV: 103.5 fL — ABNORMAL HIGH (ref 80.0–100.0)
Platelets: 170 10*3/uL (ref 150–400)
RBC: 3.43 MIL/uL — ABNORMAL LOW (ref 3.87–5.11)
RDW: 12 % (ref 11.5–15.5)
WBC: 7.2 10*3/uL (ref 4.0–10.5)
nRBC: 0 % (ref 0.0–0.2)

## 2021-12-16 SURGERY — CONE BIOPSY, CERVIX
Anesthesia: General

## 2021-12-16 MED ORDER — EPHEDRINE SULFATE-NACL 50-0.9 MG/10ML-% IV SOSY
PREFILLED_SYRINGE | INTRAVENOUS | Status: DC | PRN
  Administered 2021-12-16: 10 mg via INTRAVENOUS
  Administered 2021-12-16: 5 mg via INTRAVENOUS
  Administered 2021-12-16: 10 mg via INTRAVENOUS

## 2021-12-16 MED ORDER — ONDANSETRON HCL 4 MG/2ML IJ SOLN
INTRAMUSCULAR | Status: DC | PRN
  Administered 2021-12-16: 4 mg via INTRAVENOUS

## 2021-12-16 MED ORDER — FERRIC SUBSULFATE (BULK) SOLN
Status: DC | PRN
  Administered 2021-12-16: 1

## 2021-12-16 MED ORDER — EPHEDRINE 5 MG/ML INJ
INTRAVENOUS | Status: AC
  Filled 2021-12-16: qty 5

## 2021-12-16 MED ORDER — FENTANYL CITRATE (PF) 100 MCG/2ML IJ SOLN
INTRAMUSCULAR | Status: AC
Start: 2021-12-16 — End: ?
  Filled 2021-12-16: qty 2

## 2021-12-16 MED ORDER — DEXAMETHASONE SODIUM PHOSPHATE 10 MG/ML IJ SOLN
INTRAMUSCULAR | Status: AC
  Filled 2021-12-16: qty 1

## 2021-12-16 MED ORDER — OXYCODONE HCL 5 MG/5ML PO SOLN: 5.0000 mg | Freq: Once | ORAL | Status: DC | PRN

## 2021-12-16 MED ORDER — LIDOCAINE 2% (20 MG/ML) 5 ML SYRINGE
INTRAMUSCULAR | Status: DC | PRN
  Administered 2021-12-16: 60 mg via INTRAVENOUS

## 2021-12-16 MED ORDER — PROPOFOL 10 MG/ML IV BOLUS
INTRAVENOUS | Status: DC | PRN
  Administered 2021-12-16: 200 mg via INTRAVENOUS

## 2021-12-16 MED ORDER — MIDAZOLAM HCL 5 MG/5ML IJ SOLN
INTRAMUSCULAR | Status: DC | PRN
Start: 2021-12-16 — End: 2021-12-16
  Administered 2021-12-16: 2 mg via INTRAVENOUS

## 2021-12-16 MED ORDER — ACETAMINOPHEN 500 MG PO TABS
1000.0000 mg | ORAL_TABLET | ORAL | Status: AC
Start: 2021-12-16 — End: 2021-12-16
  Administered 2021-12-16: 1000 mg via ORAL

## 2021-12-16 MED ORDER — KETOROLAC TROMETHAMINE 30 MG/ML IJ SOLN
INTRAMUSCULAR | Status: DC | PRN
  Administered 2021-12-16: 15 mg via INTRAVENOUS

## 2021-12-16 MED ORDER — PROPOFOL 10 MG/ML IV BOLUS
INTRAVENOUS | Status: AC
  Filled 2021-12-16: qty 20

## 2021-12-16 MED ORDER — FENTANYL CITRATE (PF) 100 MCG/2ML IJ SOLN: 25.0000 ug | INTRAMUSCULAR | Status: DC | PRN

## 2021-12-16 MED ORDER — KETOROLAC TROMETHAMINE 30 MG/ML IJ SOLN
INTRAMUSCULAR | Status: AC
  Filled 2021-12-16: qty 1

## 2021-12-16 MED ORDER — BUPIVACAINE-EPINEPHRINE 0.25% -1:200000 IJ SOLN
INTRAMUSCULAR | Status: DC | PRN
  Administered 2021-12-16: 20 mL

## 2021-12-16 MED ORDER — LACTATED RINGERS IV SOLN: INTRAVENOUS | Status: DC

## 2021-12-16 MED ORDER — DEXAMETHASONE SODIUM PHOSPHATE 10 MG/ML IJ SOLN
INTRAMUSCULAR | Status: DC | PRN
  Administered 2021-12-16: 10 mg via INTRAVENOUS

## 2021-12-16 MED ORDER — FENTANYL CITRATE (PF) 100 MCG/2ML IJ SOLN
INTRAMUSCULAR | Status: DC | PRN
  Administered 2021-12-16: 50 ug via INTRAVENOUS

## 2021-12-16 MED ORDER — POVIDONE-IODINE 10 % EX SWAB: 2.0000 "application " | Freq: Once | CUTANEOUS | Status: DC

## 2021-12-16 MED ORDER — LIDOCAINE HCL (PF) 2 % IJ SOLN
INTRAMUSCULAR | Status: AC
  Filled 2021-12-16: qty 5

## 2021-12-16 MED ORDER — ONDANSETRON HCL 4 MG/2ML IJ SOLN
INTRAMUSCULAR | Status: AC
  Filled 2021-12-16: qty 2

## 2021-12-16 MED ORDER — PHENYLEPHRINE 80 MCG/ML (10ML) SYRINGE FOR IV PUSH (FOR BLOOD PRESSURE SUPPORT)
PREFILLED_SYRINGE | INTRAVENOUS | Status: DC | PRN
  Administered 2021-12-16: 80 ug via INTRAVENOUS

## 2021-12-16 MED ORDER — ACETAMINOPHEN 500 MG PO TABS
ORAL_TABLET | ORAL | Status: AC
  Filled 2021-12-16: qty 2

## 2021-12-16 MED ORDER — KETOROLAC TROMETHAMINE 30 MG/ML IJ SOLN: 30.0000 mg | Freq: Once | INTRAMUSCULAR | Status: DC | PRN

## 2021-12-16 MED ORDER — AMISULPRIDE (ANTIEMETIC) 5 MG/2ML IV SOLN: 10.0000 mg | Freq: Once | INTRAVENOUS | Status: DC | PRN

## 2021-12-16 MED ORDER — MIDAZOLAM HCL 2 MG/2ML IJ SOLN
INTRAMUSCULAR | Status: AC
  Filled 2021-12-16: qty 2

## 2021-12-16 MED ORDER — OXYCODONE HCL 5 MG PO TABS: 5.0000 mg | ORAL_TABLET | Freq: Once | ORAL | Status: DC | PRN

## 2021-12-16 SURGICAL SUPPLY — 37 items
APL SWBSTK 6 STRL LF DISP (MISCELLANEOUS)
APPLICATOR COTTON TIP 6 STRL (MISCELLANEOUS) IMPLANT
APPLICATOR COTTON TIP 6IN STRL (MISCELLANEOUS)
BLADE SURG 11 STRL SS (BLADE) ×2 IMPLANT
CATH ROBINSON RED A/P 16FR (CATHETERS) ×2 IMPLANT
ELECT BALL LEEP 5MM RED (ELECTRODE) ×3 IMPLANT
ELECT LOOP LEEP SQR 10X10 ORG (CUTTING LOOP) ×4
ELECT REM PT RETURN 9FT ADLT (ELECTROSURGICAL) ×2
ELECTRODE LOOP LP SQR 10X10ORG (CUTTING LOOP) IMPLANT
ELECTRODE REM PT RTRN 9FT ADLT (ELECTROSURGICAL) ×1 IMPLANT
GAUZE 4X4 16PLY ~~LOC~~+RFID DBL (SPONGE) ×2 IMPLANT
GLOVE BIO SURGEON STRL SZ 6 (GLOVE) ×1 IMPLANT
GLOVE BIOGEL PI IND STRL 6 (GLOVE) IMPLANT
GLOVE BIOGEL PI IND STRL 7.0 (GLOVE) ×1 IMPLANT
GLOVE BIOGEL PI INDICATOR 6 (GLOVE) ×1
GLOVE BIOGEL PI INDICATOR 7.0 (GLOVE) ×1
GLOVE ECLIPSE 7.0 STRL STRAW (GLOVE) ×2 IMPLANT
GOWN STRL REUS W/ TWL LRG LVL3 (GOWN DISPOSABLE) IMPLANT
GOWN STRL REUS W/TWL LRG LVL3 (GOWN DISPOSABLE) ×2
GOWN STRL REUS W/TWL XL LVL3 (GOWN DISPOSABLE) ×2 IMPLANT
KIT TURNOVER CYSTO (KITS) ×2 IMPLANT
NS IRRIG 1000ML POUR BTL (IV SOLUTION) ×2 IMPLANT
PACK VAGINAL WOMENS (CUSTOM PROCEDURE TRAY) ×2 IMPLANT
PAD OB MATERNITY 4.3X12.25 (PERSONAL CARE ITEMS) ×2 IMPLANT
PAD PREP 24X48 CUFFED NSTRL (MISCELLANEOUS) ×2 IMPLANT
PANTS MESH DISP LRG (UNDERPADS AND DIAPERS) ×1 IMPLANT
PANTS MESH DISPOSABLE L (UNDERPADS AND DIAPERS) ×1
PENCIL BUTTON HOLSTER BLD 10FT (ELECTRODE) ×1 IMPLANT
SCOPETTES 8  STERILE (MISCELLANEOUS) ×1
SCOPETTES 8 STERILE (MISCELLANEOUS) ×1 IMPLANT
SPONGE SURGIFOAM ABS GEL 12-7 (HEMOSTASIS) IMPLANT
SUT VIC AB 0 CT1 36 (SUTURE) ×2 IMPLANT
SUT VIC AB 3-0 SH 27 (SUTURE) ×2
SUT VIC AB 3-0 SH 27X BRD (SUTURE) ×1 IMPLANT
TOWEL OR 17X26 10 PK STRL BLUE (TOWEL DISPOSABLE) ×4 IMPLANT
TUBE CONNECTING 12X1/4 (SUCTIONS) ×1 IMPLANT
YANKAUER SUCT BULB TIP NO VENT (SUCTIONS) ×1 IMPLANT

## 2021-12-16 NOTE — Anesthesia Postprocedure Evaluation (Signed)
Anesthesia Post Note ? ?Patient: Kathy Garcia ? ?Procedure(s) Performed: LEEP  CONIZATION CERVIX WITH BIOPSY ? ?  ? ?Patient location during evaluation: PACU ?Anesthesia Type: General ?Level of consciousness: awake ?Pain management: pain level controlled ?Vital Signs Assessment: post-procedure vital signs reviewed and stable ?Respiratory status: spontaneous breathing, nonlabored ventilation, respiratory function stable and patient connected to nasal cannula oxygen ?Cardiovascular status: blood pressure returned to baseline and stable ?Postop Assessment: no apparent nausea or vomiting ?Anesthetic complications: no ? ? ?No notable events documented. ? ?Last Vitals:  ?Vitals:  ? 12/16/21 1330 12/16/21 1400  ?BP: 121/69 126/78  ?Pulse: 79 67  ?Resp: 13 16  ?Temp:    ?SpO2: 100% 100%  ?  ?Last Pain:  ?Vitals:  ? 12/16/21 1400  ?TempSrc:   ?PainSc: 0-No pain  ? ? ?  ?  ?  ?  ?  ?  ? ?Wyley Hack P Jakin Pavao ? ? ? ? ?

## 2021-12-16 NOTE — Anesthesia Preprocedure Evaluation (Addendum)
Anesthesia Evaluation  ?Patient identified by MRN, date of birth, ID band ?Patient awake ? ? ? ?Reviewed: ?Allergy & Precautions, NPO status , Patient's Chart, lab work & pertinent test results ? ?Airway ?Mallampati: II ? ?TM Distance: >3 FB ?Neck ROM: Full ? ? ? Dental ?no notable dental hx. ? ?  ?Pulmonary ?asthma , COPD, Current SmokerPatient did not abstain from smoking.,  ?  ?Pulmonary exam normal ? ? ? ? ? ? ? Cardiovascular ?negative cardio ROS ?Normal cardiovascular exam ? ? ?  ?Neuro/Psych ? Headaches, PSYCHIATRIC DISORDERS Anxiety Depression   ? GI/Hepatic ?negative GI ROS, Neg liver ROS,   ?Endo/Other  ?negative endocrine ROS ? Renal/GU ?negative Renal ROS  ? ?  ?Musculoskeletal ? ?(+) Arthritis ,  ? Abdominal ?  ?Peds ? Hematology ? ?(+) Blood dyscrasia, anemia ,   ?Anesthesia Other Findings ?CIN 2 ? Reproductive/Obstetrics ? ?  ? ? ? ? ? ? ? ? ? ? ? ? ? ?  ?  ? ? ? ? ? ? ? ?Anesthesia Physical ?Anesthesia Plan ? ?ASA: 2 ? ?Anesthesia Plan: General  ? ?Post-op Pain Management:   ? ?Induction: Intravenous ? ?PONV Risk Score and Plan: 2 and Ondansetron, Dexamethasone, Midazolam and Treatment may vary due to age or medical condition ? ?Airway Management Planned: LMA ? ?Additional Equipment:  ? ?Intra-op Plan:  ? ?Post-operative Plan: Extubation in OR ? ?Informed Consent: I have reviewed the patients History and Physical, chart, labs and discussed the procedure including the risks, benefits and alternatives for the proposed anesthesia with the patient or authorized representative who has indicated his/her understanding and acceptance.  ? ? ? ?Dental advisory given ? ?Plan Discussed with: CRNA ? ?Anesthesia Plan Comments:   ? ? ? ? ? ? ?Anesthesia Quick Evaluation ? ?

## 2021-12-16 NOTE — Interval H&P Note (Signed)
History and Physical Interval Note: ? ?12/16/2021 ?12:15 PM ? ?Kathy Garcia  has presented today for surgery, with the diagnosis of CIN 2.  The various methods of treatment have been discussed with the patient and family. After consideration of risks, benefits and other options for treatment, the patient has consented to  Procedure(s): ?COLD KNIFE CONIZATION CERVIX WITH BIOPSY (N/A) as a surgical intervention.  The patient's history has been reviewed, patient examined, no change in status, stable for surgery.  I have reviewed the patient's chart and labs.  Questions were answered to the patient's satisfaction.   ? ? ?[name] ? ? ?

## 2021-12-16 NOTE — Discharge Instructions (Signed)

## 2021-12-16 NOTE — Op Note (Signed)
Preoperative diagnosis:  ?Pap ASCUS with + HPV, + 18,45 ?Colpo Biopsy CIN1  ?ECC CIN2 ? ?Postoperative diagnosis: Same ? ?Procedure: LEEP ? ?Surgeon: Tinnie Gens, M.D. ? ?Anesthesia: GETT - Ellender, Catheryn Bacon, MD ? ?Findings: cervical polyp noted ? ?Estimated blood loss: 5 cc ? ?Specimens: Cervical LEEP conization ? ?Reason for procedure: Kathy Garcia G2P2 with h/o abnormal pap and colpo who was unable to tolerate in office procedure. ? ?Procedure: Patient was taken to the operating room where  analgesia was administered.She was prepped and draped in the usual sterile fashion. A timeout was performed.  ?The patient was placed in lithotomy position and the bivalved coated speculum was placed in the patient's vagina. A grounding pad placed on the patient. Local anesthesia was administered via an intracervical block using 20 ml of 2% Lidocaine with epinephrine. Colposcopy performed. The suction was turned on and the Small 1X Fisher Cone Biopsy Excisor on 16 Watts of blended current was used to excise the entire transformation zone. Excellent hemostasis was achieved using roller ball coagulation set at 50 Watts coagulation current. Monsel's solution was then applied and the speculum was removed from the vagina. Specimens were sent to pathology. All instrument, needle and lap counts were correct x 2. The patient was taken to recovery in stable condition. ? ? ?Reva Bores, MD ?12/16/2021 ?1:16 PM ? ?

## 2021-12-16 NOTE — Anesthesia Procedure Notes (Signed)
Procedure Name: LMA Insertion ?Date/Time: 12/16/2021 12:45 PM ?Performed by: Rogers Blocker, CRNA ?Pre-anesthesia Checklist: Patient identified, Emergency Drugs available, Suction available and Patient being monitored ?Patient Re-evaluated:Patient Re-evaluated prior to induction ?Oxygen Delivery Method: Circle System Utilized ?Preoxygenation: Pre-oxygenation with 100% oxygen ?Induction Type: IV induction ?Ventilation: Mask ventilation without difficulty ?LMA: LMA inserted ?LMA Size: 4.0 ?Number of attempts: 1 ?Placement Confirmation: positive ETCO2 ?Tube secured with: Tape ?Dental Injury: Teeth and Oropharynx as per pre-operative assessment  ? ? ? ? ?

## 2021-12-16 NOTE — Transfer of Care (Signed)
Immediate Anesthesia Transfer of Care Note ? ?Patient: Kathy Garcia ? ?Procedure(s) Performed: LEEP  CONIZATION CERVIX WITH BIOPSY ? ?Patient Location: PACU ? ?Anesthesia Type:General ? ?Level of Consciousness: awake, alert , oriented and patient cooperative ? ?Airway & Oxygen Therapy: Pt spontaneously breathing ? ?Post-op Assessment: Report given to RN and Post -op Vital signs reviewed and stable ? ?Post vital signs: Reviewed and stable ? ?Last Vitals:  ?Vitals Value Taken Time  ?BP    ?Temp 36.4 ?C 12/16/21 1324  ?Pulse 85 12/16/21 1327  ?Resp 21 12/16/21 1327  ?SpO2 100 % 12/16/21 1327  ?Vitals shown include unvalidated device data. ? ?Last Pain:  ?Vitals:  ? 12/16/21 1119  ?TempSrc: Oral  ?PainSc: 7   ?   ? ?Patients Stated Pain Goal: 7 (12/16/21 1119) ? ?Complications: No notable events documented. ?

## 2021-12-17 ENCOUNTER — Encounter (HOSPITAL_BASED_OUTPATIENT_CLINIC_OR_DEPARTMENT_OTHER): Payer: Self-pay | Admitting: Family Medicine

## 2021-12-18 LAB — SURGICAL PATHOLOGY

## 2021-12-22 ENCOUNTER — Telehealth: Payer: Self-pay | Admitting: *Deleted

## 2021-12-22 NOTE — Telephone Encounter (Signed)
-----   Message from Reva Bores, MD sent at 12/22/2021  8:13 AM EDT ----- Recall pap in 1 year.

## 2021-12-22 NOTE — Telephone Encounter (Signed)
Left message for pt to call back regarding results.  

## 2021-12-25 ENCOUNTER — Telehealth: Payer: Self-pay | Admitting: *Deleted

## 2021-12-25 NOTE — Telephone Encounter (Signed)
-----   Message from Alvin Critchley sent at 12/25/2021  2:10 PM EDT ----- Regarding: Nurse Call Pt called in not feeing well. She returned to work on Tuesday and had a rough day. She is still bleeding. In her rib cage she is having pain while breathing, it started around 5 last night. It did not subside until after midnight. Pt was off yesterday and did fine. Pt is back at work today and having trouble breathing. She experience some nausea and vomiting on Saturday. Pt is able to eat bland but is still losing weight. She has lost more weight during after her procedure. She would like to speak to clinical staff. She is drinking a lot of water and still feels thirsty. Pt tries to take her temperature because she is getting waves of heat all of a sudden and checks her temp and its around 96 degrees. Her belly is tender to touch. 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   Pt can not access MyChart  due to signal.

## 2021-12-25 NOTE — Telephone Encounter (Signed)
Attempted to call pt back. No answer and so I left a voice mail stating the due to the symptoms she reported in her message that she needs to go to the ED. Also stated I would leave a mychart message.

## 2021-12-31 ENCOUNTER — Encounter: Payer: Self-pay | Admitting: Family Medicine

## 2021-12-31 ENCOUNTER — Ambulatory Visit (INDEPENDENT_AMBULATORY_CARE_PROVIDER_SITE_OTHER): Payer: No Typology Code available for payment source | Admitting: Family Medicine

## 2021-12-31 VITALS — BP 128/81 | HR 80

## 2021-12-31 DIAGNOSIS — N871 Moderate cervical dysplasia: Secondary | ICD-10-CM

## 2021-12-31 DIAGNOSIS — R1013 Epigastric pain: Secondary | ICD-10-CM | POA: Diagnosis not present

## 2021-12-31 NOTE — Assessment & Plan Note (Addendum)
Pathology only reveals CIN1. Will f/u pap in 6 months. Nothing per vagina x 1-2 more weeks until healing is complete.

## 2021-12-31 NOTE — Progress Notes (Signed)
   Subjective:    Patient ID: Kathy Garcia is a 56 y.o. female presenting with No chief complaint on file.  on 12/31/2021  HPI: Patient is s/p LEEP in the OR. The pathology shows CIN1 only with positive lateral margins. Notes some bleeding and vaginal discharge and odor.  Reports some epigastric pain and firmness noted after eating and pain in her rib cage. Felt some shortness of breath.  Review of Systems  Constitutional:  Negative for chills and fever.  Respiratory:  Negative for shortness of breath.   Cardiovascular:  Negative for chest pain.  Gastrointestinal:  Negative for abdominal pain, nausea and vomiting.  Genitourinary:  Negative for dysuria.  Skin:  Negative for rash.     Objective:    BP 128/81   Pulse 80   LMP 12/01/2012 (Exact Date)  Physical Exam Exam conducted with a chaperone present.  Constitutional:      General: She is not in acute distress.    Appearance: She is well-developed.  HENT:     Head: Normocephalic and atraumatic.  Eyes:     General: No scleral icterus. Cardiovascular:     Rate and Rhythm: Normal rate.  Pulmonary:     Effort: Pulmonary effort is normal.  Abdominal:     Palpations: Abdomen is soft.  Genitourinary:    Comments: Cervix still with some eschar and discharge. Musculoskeletal:     Cervical back: Neck supple.  Skin:    General: Skin is warm and dry.  Neurological:     Mental Status: She is alert and oriented to person, place, and time.       Assessment & Plan:   Problem List Items Addressed This Visit       Unprioritized   Dysplasia of cervix, high grade CIN 2    Pathology only reveals CIN1. Will f/u pap in 6 months. Nothing per vagina x 1-2 more weeks until healing is complete.       Other Visit Diagnoses     Epigastric pain    -  Primary   Unclear etiology--f/u with PCP and possible GI      Return in about 6 months (around 07/02/2022) for repeat pap.  Reva Bores, MD 12/31/2021 7:59 AM

## 2022-02-09 ENCOUNTER — Telehealth: Payer: Self-pay | Admitting: Gastroenterology

## 2022-02-09 NOTE — Telephone Encounter (Signed)
Called patient and she stated that she had not been able to have a bowel movement since last week. She stated that her abdomen is painful and feels bloated. Patient stated that she stays nausea but no vomiting. Patient would like to know what she is able to do. Patient wanted to be seen. However, I told her that we did not have anything to offer her until 03/26/2022. Please advise.

## 2022-02-09 NOTE — Telephone Encounter (Signed)
Patient called to schedule an appointment. States she cannot make a bowel movement at all and is bloated really bad " like a pregnant women."  Patient was crying and could barely talk. Requesting a call back.

## 2022-02-09 NOTE — Telephone Encounter (Signed)
Commence on miralax BID after an enema- if doesn't work in a week let us know, stop [place]

## 2022-02-09 NOTE — Telephone Encounter (Signed)
Called patient back to let her know what Dr. Johnney Killian recommendations were and she started to cry stating that she had stopped taking Questran. However, on Saturday she began taking it again because she felt that it would help her with her bowels since she is still not able to form her stools. Patient stated that she continued taking [place]. Patient stated that she will continue taking [place]. Please advise.

## 2022-02-10 NOTE — Telephone Encounter (Signed)
Questran causes constipation - please inform - I believe constipation was her main complaint that she hadnt had a bowel movement ?

## 2022-02-10 NOTE — Telephone Encounter (Signed)
Patient was contacted with recommendations of Dr. Tobi Bastos and patient stated that she will do the enema tonight again sine yesterday she did it and she was abe to have a bowel movement. However, patient refuses to stop the cholestyramine because if she did, then her constipation will restart and all of her symptoms will multiply. Patient scheduled an appointment to be seen because she wanted to speak to Dr. Tobi Bastos. Appointment 03/03/2022 at 3:15 PM

## 2022-02-12 ENCOUNTER — Ambulatory Visit (INDEPENDENT_AMBULATORY_CARE_PROVIDER_SITE_OTHER): Payer: No Typology Code available for payment source | Admitting: Family Medicine

## 2022-02-12 ENCOUNTER — Ambulatory Visit (INDEPENDENT_AMBULATORY_CARE_PROVIDER_SITE_OTHER)
Admission: RE | Admit: 2022-02-12 | Discharge: 2022-02-12 | Disposition: A | Discharge status: Home / Self Care | Payer: No Typology Code available for payment source | Source: Ambulatory Visit | Attending: Family Medicine | Admitting: Family Medicine

## 2022-02-12 VITALS — BP 122/70 | HR 91 | Temp 97.6°F | Wt 139.1 lb

## 2022-02-12 DIAGNOSIS — R634 Abnormal weight loss: Secondary | ICD-10-CM

## 2022-02-12 DIAGNOSIS — Z720 Tobacco use: Secondary | ICD-10-CM | POA: Diagnosis not present

## 2022-02-12 DIAGNOSIS — K5900 Constipation, unspecified: Secondary | ICD-10-CM

## 2022-02-12 DIAGNOSIS — F331 Major depressive disorder, recurrent, moderate: Secondary | ICD-10-CM

## 2022-02-12 DIAGNOSIS — K529 Noninfective gastroenteritis and colitis, unspecified: Secondary | ICD-10-CM

## 2022-02-12 DIAGNOSIS — F411 Generalized anxiety disorder: Secondary | ICD-10-CM | POA: Diagnosis not present

## 2022-02-12 MED ORDER — BUPROPION HCL ER (XL) 150 MG PO TB24: ORAL_TABLET | ORAL | 0 refills | Status: DC

## 2022-02-12 NOTE — Assessment & Plan Note (Signed)
Chest x-ray today is reassuring.  Still no clear cause other than GI issues for her weight loss.  She is a candidate for lung cancer screening we will discuss this at next visit.

## 2022-02-12 NOTE — Assessment & Plan Note (Signed)
Restart Wellbutrin 150 mg increased to 300 mg after 1 week.  Follow-up in 1 month

## 2022-02-12 NOTE — Assessment & Plan Note (Signed)
Some improvement with Prevalite.  No now with some constipation, which is noted on KUB.  Encouraged her to continue with her daily oral intake.  And follow-up with GI.

## 2022-02-12 NOTE — Progress Notes (Signed)
Subjective:     Kathy Garcia is a 56 y.o. female presenting for Follow-up (Abdominal pain and constipation )     HPI  #mental health - "bouncing off the walls" - having a hard time gathering thoughts to be able to have a conversation - saw OB/GYN - getting surgery in May - had a LEEP CIN1 - continues to have constipation/diarrhea - is taking prevalite - will get constipation - nausea has improved - is eating again - small quantities - has GI appt 03/2022 - took a suppository/enema - continues to have small stools - urgency but no stool  - wellbutrin 150 mg  - started this medication - has been off this medication x 2 months - things are worse than when she started - not sure if it helped when she was on the medication  Tobacco - 10 per day - did get down to 5-6 cig/day but went back up   Review of Systems   Social History   Tobacco Use  Smoking Status Every Day   Packs/day: 0.50   Years: 25.00   Total pack years: 12.50   Types: Cigarettes  Smokeless Tobacco Never  Tobacco Comments   1 ppd x 20 years, quit for 10 years, restarted 2018        Objective:    BP Readings from Last 3 Encounters:  02/12/22 122/70  12/31/21 128/81  12/16/21 126/78   Wt Readings from Last 3 Encounters:  02/12/22 139 lb 2 oz (63.1 kg)  12/16/21 139 lb 6.4 oz (63.2 kg)  10/23/21 148 lb (67.1 kg)    BP 122/70   Pulse 91   Temp 97.6 F (36.4 C) (Temporal)   Wt 139 lb 2 oz (63.1 kg)   LMP 12/01/2012 (Exact Date)   SpO2 96%   BMI 23.88 kg/m    Physical Exam Constitutional:      General: She is not in acute distress.    Appearance: She is well-developed. She is not diaphoretic.  HENT:     Right Ear: External ear normal.     Left Ear: External ear normal.     Nose: Nose normal.  Eyes:     Conjunctiva/sclera: Conjunctivae normal.  Cardiovascular:     Rate and Rhythm: Normal rate and regular rhythm.     Heart sounds: No murmur heard. Pulmonary:     Effort:  Pulmonary effort is normal. No respiratory distress.     Breath sounds: Normal breath sounds. No wheezing.  Abdominal:     General: Abdomen is flat. There is no distension.     Palpations: Abdomen is soft.     Tenderness: There is no abdominal tenderness. There is no guarding.  Musculoskeletal:     Cervical back: Neck supple.  Skin:    General: Skin is warm and dry.     Capillary Refill: Capillary refill takes less than 2 seconds.  Neurological:     Mental Status: She is alert. Mental status is at baseline.  Psychiatric:        Mood and Affect: Mood normal.        Behavior: Behavior normal.     DG Chest 2 View CLINICAL DATA:  56 year old female presenting for evaluationz of weight loss with chest pain.  EXAM: CHEST - 2 VIEW  COMPARISON:  June 16, 2017.  FINDINGS: The heart size and mediastinal contours are within normal limits. Both lungs are clear. The visualized skeletal structures are unremarkable.  IMPRESSION: No active cardiopulmonary disease.  Electronically Signed   By: Donzetta Kohut M.D.   On: 02/12/2022 14:32 DG Abd 1 View CLINICAL DATA:  z constipation and weight loss in a 56 year old female.  EXAM: ABDOMEN - 1 VIEW  COMPARISON:  Prior CT of the abdomen and pelvis. This from January 28, 2021. Chest x-ray of the same day.  FINDINGS: Lung bases are clear.  Post cholecystectomy.  Bowel gas pattern without signs of obstruction. Scattered nondilated loops of small bowel in the abdomen.  Moderate stool in the ascending colon with mild gaseous distension of the colon beyond this level with gas also in the rectum.  Query small RIGHT intrarenal calculus at 3 mm. LEFT renal contours obscured by bowel gas.  Soft tissues are grossly unremarkable.  On limited assessment there is no acute skeletal finding with signs of lumbar degenerative changes greatest at L4-5 on the RIGHT.  IMPRESSION: 1. Moderate stool in the ascending colon could reflect  changes related to constipation. No bowel obstruction. 2. Query small RIGHT intrarenal calculus. 3. Post cholecystectomy.  Electronically Signed   By: Donzetta Kohut M.D.   On: 02/12/2022 14:30        Assessment & Plan:   Problem List Items Addressed This Visit       Digestive   Chronic diarrhea    Some improvement with Prevalite.  No now with some constipation, which is noted on KUB.  Encouraged her to continue with her daily oral intake.  And follow-up with GI.      Relevant Orders   DG Abd 1 View (Completed)     Other   Generalized anxiety disorder - Primary    Poorly controlled, in setting of stopping medication.  She had poor follow-up so unclear if Wellbutrin adverse helped.  Advised that she restart Wellbutrin, 150 mg increased to 300 mg after 1 week.  Return in 4 weeks for recheck      Relevant Medications   buPROPion (WELLBUTRIN XL) 150 MG 24 hr tablet   Other Relevant Orders   Ambulatory referral to Psychiatry   Major depressive disorder    Restart Wellbutrin 150 mg increased to 300 mg after 1 week.  Follow-up in 1 month      Relevant Medications   buPROPion (WELLBUTRIN XL) 150 MG 24 hr tablet   Other Relevant Orders   Ambulatory referral to Psychiatry   Tobacco abuse    Chest x-ray today is reassuring.  Still no clear cause other than GI issues for her weight loss.  She is a candidate for lung cancer screening we will discuss this at next visit.      Relevant Orders   DG Chest 2 View (Completed)   Weight loss    Her weight in 2021 was 186 pounds.  Greatest time of weight loss was associated with nausea and persistent diarrhea as well as poor p.o. intake.  HIV and hepatitis C were both negative in summer 2022.  She was recently diagnosed with CIN-1 and completed a LEEP procedure.  She is following GI with GI, and her GI losses have improved but she has not been putting on weight. She notes she is eating more.  Chest x-ray was done today which was  reassuring.  No clear explanation for her weight loss at this time we will monitor and see if she gains weight now that GI symptoms are being managed.  She is overdue for breast cancer screening.  We will also consider formal lung cancer screening with CT.  Relevant Orders   DG Chest 2 View (Completed)   Other Visit Diagnoses     Constipation, unspecified constipation type       Relevant Orders   DG Abd 1 View (Completed)      I spent >37 minutes with pt , obtaining history, examining, reviewing chart, documenting encounter and discussing the above plan of care.    Return in about 4 weeks (around 03/12/2022) for depression/anxiety.  Lynnda Child, MD

## 2022-02-12 NOTE — Assessment & Plan Note (Signed)
Poorly controlled, in setting of stopping medication.  She had poor follow-up so unclear if Wellbutrin adverse helped.  Advised that she restart Wellbutrin, 150 mg increased to 300 mg after 1 week.  Return in 4 weeks for recheck

## 2022-02-12 NOTE — Patient Instructions (Addendum)
Enema - should be only one type - store brand is OK -- FLEET Enema   X-ray today  Start wellbutrin Psychiatry referral - They will call   Call the mychart help desk to get this sorted   Find a therapist  -- [facility] is one option. Call [phone]

## 2022-02-12 NOTE — Assessment & Plan Note (Addendum)
Her weight in 2021 was 186 pounds.  Greatest time of weight loss was associated with nausea and persistent diarrhea as well as poor p.o. intake.  HIV and hepatitis C were both negative in summer 2022.  She was recently diagnosed with CIN-1 and completed a LEEP procedure.  She is following GI with GI, and her GI losses have improved but she has not been putting on weight. She notes she is eating more.  Chest x-ray was done today which was reassuring.  No clear explanation for her weight loss at this time we will monitor and see if she gains weight now that GI symptoms are being managed.  She is overdue for breast cancer screening.  We will also consider formal lung cancer screening with CT.

## 2022-02-23 ENCOUNTER — Telehealth: Payer: Self-pay

## 2022-02-23 DIAGNOSIS — Z8619 Personal history of other infectious and parasitic diseases: Secondary | ICD-10-CM

## 2022-02-23 NOTE — Telephone Encounter (Signed)
Patient stated that she restarted to have the same symptoms (Bile salt-induced diarrhea) as when she just came to see you and wanted to know what she is able to do in the meantime before she came in to see you on 03/03/22. Patient stated that she had stopped taking Cholestyramine for 3 weeks but took them this Saturday. Patient would like to know what to do in the meantime. Please advise.

## 2022-02-23 NOTE — Telephone Encounter (Signed)
Called patient back with Dr. Johnney Killian recommendations but patient did not answer. However, I left her a detailed message letting her know his recommendations and that I would place the stool tests orders and once we had results, we would let her know.

## 2022-02-26 LAB — GI PROFILE, STOOL, PCR

## 2022-02-26 LAB — CALPROTECTIN, FECAL: Calprotectin, Fecal: 289 ug/g — ABNORMAL HIGH (ref 0–120)

## 2022-02-28 LAB — C DIFFICILE TOXINS A+B W/RFLX: C difficile Toxins A+B, EIA: NEGATIVE

## 2022-02-28 LAB — C DIFFICILE, CYTOTOXIN B

## 2022-03-03 ENCOUNTER — Telehealth: Payer: Self-pay | Admitting: Gastroenterology

## 2022-03-03 ENCOUNTER — Other Ambulatory Visit: Payer: Self-pay

## 2022-03-03 ENCOUNTER — Encounter: Payer: Self-pay | Admitting: Gastroenterology

## 2022-03-03 ENCOUNTER — Ambulatory Visit (INDEPENDENT_AMBULATORY_CARE_PROVIDER_SITE_OTHER): Payer: No Typology Code available for payment source | Admitting: Gastroenterology

## 2022-03-03 VITALS — BP 109/70 | HR 91 | Temp 98.9°F | Wt 138.8 lb

## 2022-03-03 DIAGNOSIS — K9089 Other intestinal malabsorption: Secondary | ICD-10-CM | POA: Diagnosis not present

## 2022-03-03 DIAGNOSIS — R634 Abnormal weight loss: Secondary | ICD-10-CM | POA: Diagnosis not present

## 2022-03-03 MED ORDER — CHOLESTYRAMINE LIGHT 4 G PO PACK: 4.0000 g | PACK | Freq: Every day | ORAL | 3 refills | Status: DC

## 2022-03-03 NOTE — Patient Instructions (Addendum)
High-Fiber Eating Plan Fiber, also called dietary fiber, is a type of carbohydrate. It is found foods such as fruits, vegetables, whole grains, and beans. A high-fiber diet can have many health benefits. Your health care provider may recommend a high-fiber diet to help: Prevent constipation. Fiber can make your bowel movements more regular. Lower your cholesterol. Relieve the following conditions: Inflammation of veins in the anus (hemorrhoids). Inflammation of specific areas of the digestive tract (uncomplicated diverticulosis). A problem of the large intestine, also called the colon, that sometimes causes pain and diarrhea (irritable bowel syndrome, or IBS). Prevent overeating as part of a weight-loss plan. Prevent heart disease, type 2 diabetes, and certain cancers. What are tips for following this plan? Reading food labels  Check the nutrition facts label on food products for the amount of dietary fiber. Choose foods that have 5 grams of fiber or more per serving. The goals for recommended daily fiber intake include: Men (age 50 or younger): 34-38 g. Men (over age 50): 28-34 g. Women (age 50 or younger): 25-28 g. Women (over age 50): 22-25 g. Your daily fiber goal is _____________ g. Shopping Choose whole fruits and vegetables instead of processed forms, such as apple juice or applesauce. Choose a wide variety of high-fiber foods such as avocados, lentils, oats, and kidney beans. Read the nutrition facts label of the foods you choose. Be aware of foods with added fiber. These foods often have high sugar and sodium amounts per serving. Cooking Use whole-grain flour for baking and cooking. Cook with brown rice instead of white rice. Meal planning Start the day with a breakfast that is high in fiber, such as a cereal that contains 5 g of fiber or more per serving. Eat breads and cereals that are made with whole-grain flour instead of refined flour or white flour. Eat brown rice, bulgur  wheat, or millet instead of white rice. Use beans in place of meat in soups, salads, and pasta dishes. Be sure that half of the grains you eat each day are whole grains. General information You can get the recommended daily intake of dietary fiber by: Eating a variety of fruits, vegetables, grains, nuts, and beans. Taking a fiber supplement if you are not able to take in enough fiber in your diet. It is better to get fiber through food than from a supplement. Gradually increase how much fiber you consume. If you increase your intake of dietary fiber too quickly, you may have bloating, cramping, or gas. Drink plenty of water to help you digest fiber. Choose high-fiber snacks, such as berries, raw vegetables, nuts, and popcorn. What foods should I eat? Fruits Berries. Pears. Apples. Oranges. Avocado. Prunes and raisins. Dried figs. Vegetables Sweet potatoes. Spinach. Kale. Artichokes. Cabbage. Broccoli. Cauliflower. Green peas. Carrots. Squash. Grains Whole-grain breads. Multigrain cereal. Oats and oatmeal. Brown rice. Barley. Bulgur wheat. Millet. Quinoa. Bran muffins. Popcorn. Rye wafer crackers. Meats and other proteins Navy beans, kidney beans, and pinto beans. Soybeans. Split peas. Lentils. Nuts and seeds. Dairy Fiber-fortified yogurt. Beverages Fiber-fortified soy milk. Fiber-fortified orange juice. Other foods Fiber bars. The items listed above may not be a complete list of recommended foods and beverages. Contact a dietitian for more information. What foods should I avoid? Fruits Fruit juice. Cooked, strained fruit. Vegetables Fried potatoes. Canned vegetables. Well-cooked vegetables. Grains White bread. Pasta made with refined flour. White rice. Meats and other proteins Fatty cuts of meat. Fried chicken or fried fish. Dairy Milk. Yogurt. Cream cheese. Sour cream. Fats and   oils Butters. Beverages Soft drinks. Other foods Cakes and pastries. The items listed above may  not be a complete list of foods and beverages to avoid. Talk with your dietitian about what choices are best for you. Summary Fiber is a type of carbohydrate. It is found in foods such as fruits, vegetables, whole grains, and beans. A high-fiber diet has many benefits. It can help to prevent constipation, lower blood cholesterol, aid weight loss, and reduce your risk of heart disease, diabetes, and certain cancers. Increase your intake of fiber gradually. Increasing fiber too quickly may cause cramping, bloating, and gas. Drink plenty of water while you increase the amount of fiber you consume. The best sources of fiber include whole fruits and vegetables, whole grains, nuts, seeds, and beans. This information is not intended to replace advice given to you by your health care provider. Make sure you discuss any questions you have with your health care provider. Document Revised: 11/23/2019 Document Reviewed: 11/23/2019 Elsevier Patient Education  2023 [employer] Barrett's Esophagus  Barrett's esophagus occurs when the tissue that lines the esophagus changes or becomes damaged. The esophagus is the tube that carries food from the throat to the stomach. With Barrett's esophagus, the cells that line the esophagus are replaced by cells that are similar to the lining of the intestines (intestinal metaplasia). Barrett's esophagus itself may not cause any symptoms. However, many people who have Barrett's esophagus also have gastroesophageal reflux disease (GERD), which may cause symptoms such as heartburn. Over time, a few people with this condition may develop cancer of the esophagus. Treatment may include medicines, procedures to destroy the abnormal cells, or surgery. What are the causes? The exact cause of this condition is not known. In some cases, the condition develops from damage to the lining of the esophagus caused by gastroesophageal reflux disease (GERD). GERD occurs when stomach acids flow up  from the stomach into the esophagus. Frequent symptoms of GERD may cause intestinal metaplasia or cause cell changes (dysplasia). What increases the risk? You are more likely to develop this condition if you: Have GERD. Are female. Are of European descent. Are obese. Are older than 50. Have a hiatal hernia. This is a condition in which part of your stomach bulges into your chest. Smoke. What are the signs or symptoms? People with Barrett's esophagus often have no symptoms. However, many people with this condition also have GERD. Symptoms of GERD may include: Heartburn. Difficulty swallowing. Dry cough. How is this diagnosed? This condition may be diagnosed based on: Results of an upper gastrointestinal endoscopy. For this exam, a thin, flexible tube with a light and a camera on the end (endoscope) is passed down your esophagus. Your health care provider can view the inside of your esophagus during this procedure. Results of a biopsy. For this procedure, several tissue samples are removed (biopsy) from your esophagus to look at under a microscope. They are then checked for intestinal metaplasia or dysplasia. How is this treated? Treatment for this condition may include: Medicines (proton pump inhibitors, or PPIs) to decrease or stop GERD. Periodic endoscopic exams to make sure that cancer is not developing. A procedure or surgery for dysplasia. This may include: Removal or destruction of abnormal cells. Removal of part of the esophagus. Follow these instructions at home: Eating and drinking Eat more fruits and vegetables. Avoid fatty foods. Eat small, frequent meals instead of large meals. Avoid foods that cause heartburn. These foods include: Coffee and alcoholic drinks. Tomatoes and foods made  with tomatoes. Greasy or spicy foods. Chocolate and peppermint. Do not drink alcohol. General instructions Take over-the-counter and prescription medicines only as told by your health care  provider. Do not use any products that contain nicotine or tobacco, such as cigarettes, e-cigarettes, and chewing tobacco. If you need help quitting, ask your health care provider. If you are being treated for GERD, make sure you take medicines and follow all instructions as told by your health care provider. Keep all follow-up visits as told by your health care provider. This is important. Contact a health care provider if: You have heartburn or GERD symptoms. You have difficulty swallowing. Get help right away if: You have chest pain. You are unable to swallow. You vomit blood or material that looks like coffee grounds. Your stool (feces) is bright red or dark. These symptoms may represent a serious problem that is an emergency. Do not wait to see if the symptoms will go away. Get medical help right away. Call your local emergency services (911 in the [place] Do not drive yourself to the hospital. Summary Barrett's esophagus occurs when the tissue that lines the esophagus changes or becomes damaged. Barrett's esophagus may be diagnosed with an upper gastrointestinal endoscopy and a biopsy. Treatment may include medicines, procedures to remove abnormal cells, or surgery. Follow your health care provider's instructions about what to eat and drink, what medicines to take, and when to call for help. This information is not intended to replace advice given to you by your health care provider. Make sure you discuss any questions you have with your health care provider. Document Revised: 10/07/2019 Document Reviewed: 10/07/2019 Elsevier Patient Education  2023 [employer].

## 2022-03-03 NOTE — Progress Notes (Signed)
Wyline Mood MD, MRCP(U.K) [address]  [address]  [place], [place] [address]  Main: [phone]  Fax: [phone]   Primary Care Physician: Lynnda Child, MD  Primary Gastroenterologist:  Dr. Wyline Mood   Chief Complaint  Patient presents with   Abdominal Pain    Bloated abdominal    HPI: Kathy Garcia is a 56 y.o. female  Summary of history :     Initially seen and referred on 02/26/2021 for chronic diarrhea and abdominal pain.  She was ER on 01/28/2021 for chronic abdominal pain and diarrhea.  Ongoing diarrhea since 2014 after her gallbladder has been taken out at that point of time  she had abdominal pain and issues with constipation for over a year and a half.  Had been on antibiotics in March for a tooth extraction.01/28/2021 C. difficile testing and GI PCR were negative but the C. difficile quick test was positive for antigen.  Fecal calprotectin was elevated.  Celiac serology negative.  Treated with Questran and vancomycin  01/28/2021 CT scan of the abdomen and pelvis showed extensive colonic diverticulosis no evidence of diverticulitis left adrenal nodule otherwise normal.     She takes Imodium up to 4 times a day along with iron pills to help her with the diarrhea.  Not much artificial sugars in her diet.  Denies any NSAID use.  Denies any prior colonoscopy.  Father had colon polyps.  Has lost over 50 pounds of weight.   Denies any clear abdominal pain but has cramping in the lower part of the abdomen relieved after bowel movement.   03/27/2021: Colonoscopy with random colon biopsies showed no evidence of microscopic colitis.     Interval history  04/02/2021-03/03/2022  She called into our office a few weeks back with diarrhea obtain stool tests which were negative for C. difficile, GI PCR fecal calprotectin was elevated at 289  Presently has no diarrhea.  Rather suffering from constipation with no regular bowel movements.  Stop taking the Questran occasionally  she has breakthrough diarrhea.  She smokes cigarettes and has had nausea she states she has lost over 20 pounds since her last visit.  Smokes marijuana at times suffers from nausea on and off.  He is also being evaluated for anxiety. She believes something is not right Current Outpatient Medications  Medication Sig Dispense Refill   acetaminophen (TYLENOL) 500 MG tablet Take 500 mg by mouth every 6 (six) hours as needed.     albuterol (VENTOLIN HFA) 108 (90 Base) MCG/ACT inhaler Inhale 2 puffs into the lungs every 4 (four) hours as needed for wheezing. 8 g 1   buPROPion (WELLBUTRIN XL) 150 MG 24 hr tablet Take 150 mg daily. After 1 week if tolerating increase to 300 mg daily (2 pills). 60 tablet 0   Cholecalciferol (VITAMIN D-3 PO) Take by mouth daily.     cholestyramine light (PREVALITE) 4 g packet Take 1 packet (4 g total) by mouth 2 (two) times daily. (Patient taking differently: Take 4 g by mouth daily.) 180 packet 4   Cyanocobalamin (B-12 PO) Take by mouth daily.     Multiple Vitamin (MULTIVITAMIN) tablet Take 1 tablet by mouth daily.     ondansetron (ZOFRAN-ODT) 4 MG disintegrating tablet TAKE ONE TABLET EVERY EIGHT HOURS AS NEEDED FOR NAUSEA AND VOMITING 20 tablet 0   No current facility-administered medications for this visit.    Allergies as of 03/03/2022 - Review Complete 03/03/2022  Allergen Reaction Noted   Hydrocodone  Nausea And Vomiting 01/03/2013    ROS:  General: Negative for anorexia, weight loss, fever, chills, fatigue, weakness. ENT: Negative for hoarseness, difficulty swallowing , nasal congestion. CV: Negative for chest pain, angina, palpitations, dyspnea on exertion, peripheral edema.  Respiratory: Negative for dyspnea at rest, dyspnea on exertion, cough, sputum, wheezing.  GI: See history of present illness. GU:  Negative for dysuria, hematuria, urinary incontinence, urinary frequency, nocturnal urination.  Endo: Negative for unusual weight change.    Physical  Examination:   BP 109/70   Pulse 91   Temp 98.9 F (37.2 C) (Oral)   Wt 138 lb 12.8 oz (63 kg)   LMP 12/01/2012 (Exact Date)   BMI 23.82 kg/m   General: Well-nourished, well-developed in no acute distress.  Eyes: No icterus. Conjunctivae pink. Mouth: Oropharyngeal mucosa moist and pink , no lesions erythema or exudate. Neuro: Alert and oriented x 3.  Grossly intact. Skin: Warm and dry, no jaundice.   Psych: Alert and cooperative, normal mood and affect.   Imaging Studies: DG Chest 2 View  Result Date: 02/12/2022 CLINICAL DATA:  56 year old female presenting for evaluationz of weight loss with chest pain. EXAM: CHEST - 2 VIEW COMPARISON:  June 16, 2017. FINDINGS: The heart size and mediastinal contours are within normal limits. Both lungs are clear. The visualized skeletal structures are unremarkable. IMPRESSION: No active cardiopulmonary disease. Electronically Signed   By: Donzetta Kohut M.D.   On: 02/12/2022 14:32   DG Abd 1 View  Result Date: 02/12/2022 CLINICAL DATA:  z constipation and weight loss in a 56 year old female. EXAM: ABDOMEN - 1 VIEW COMPARISON:  Prior CT of the abdomen and pelvis. This from January 28, 2021. Chest x-ray of the same day. FINDINGS: Lung bases are clear. Post cholecystectomy. Bowel gas pattern without signs of obstruction. Scattered nondilated loops of small bowel in the abdomen. Moderate stool in the ascending colon with mild gaseous distension of the colon beyond this level with gas also in the rectum. Query small RIGHT intrarenal calculus at 3 mm. LEFT renal contours obscured by bowel gas. Soft tissues are grossly unremarkable. On limited assessment there is no acute skeletal finding with signs of lumbar degenerative changes greatest at L4-5 on the RIGHT. IMPRESSION: 1. Moderate stool in the ascending colon could reflect changes related to constipation. No bowel obstruction. 2. Query small RIGHT intrarenal calculus. 3. Post cholecystectomy. Electronically  Signed   By: Donzetta Kohut M.D.   On: 02/12/2022 14:30    Assessment and Plan:   Kathy Garcia is a 56 y.o. y/o female who has a history of C. difficile diarrhea, bile salt mediated diarrhea s/p cholecystectomy in 2014.  Previously treated with Questran with good relief.  When she called into our office she was having diarrhea which has since resolved has had on and off diarrhea at this point of time but presently constipated.  She also is concerned about unintentional weight loss has had chronic nausea but has resolved since February of this year but she is very concerned about the weight loss.  Plan 1.  For the constipation/diarrhea would increase dietary fiber patient information provided if having persistent diarrhea, recommence on Questran to be titrated to require dosage prescription has been sent to the pharmacy.  2.  I believe a part of the issues with bloating and abdominal discomfort is related to aerophagia from smoking as well as due to the effects of marijuana which I explained to her to stop both of which.  I also  suggested to complete evaluation we will proceed with endoscopy although my pretest probability of finding thing is probably low and if negative we will proceed with CT scan of the chest abdomen and pelvis in view of unintentional weight loss if it persists at the next visit.  I have discussed alternative options, risks & benefits,  which include, but are not limited to, bleeding, infection, perforation,respiratory complication & drug reaction.  The patient agrees with this plan & written consent will be obtained.       Dr Jonathon Bellows  MD,MRCP [facility]) Follow up in 2-3 months

## 2022-03-03 NOTE — Addendum Note (Signed)
Addended by: Adela Ports on: 03/03/2022 03:46 PM   Modules accepted: Orders

## 2022-03-03 NOTE — Telephone Encounter (Signed)
Patients pharmacy has changed to [facility] in [place] - [address]

## 2022-03-04 LAB — TSH: TSH: 1.26 u[IU]/mL (ref 0.450–4.500)

## 2022-03-04 NOTE — Telephone Encounter (Signed)
Patient's pharmacy was updated yesterday on her office visit.

## 2022-03-12 ENCOUNTER — Ambulatory Visit (INDEPENDENT_AMBULATORY_CARE_PROVIDER_SITE_OTHER): Payer: No Typology Code available for payment source | Admitting: Family Medicine

## 2022-03-12 VITALS — BP 100/60 | HR 82 | Temp 97.7°F | Wt 144.5 lb

## 2022-03-12 DIAGNOSIS — F331 Major depressive disorder, recurrent, moderate: Secondary | ICD-10-CM

## 2022-03-12 DIAGNOSIS — G542 Cervical root disorders, not elsewhere classified: Secondary | ICD-10-CM

## 2022-03-12 DIAGNOSIS — K5900 Constipation, unspecified: Secondary | ICD-10-CM | POA: Diagnosis not present

## 2022-03-12 DIAGNOSIS — F411 Generalized anxiety disorder: Secondary | ICD-10-CM | POA: Diagnosis not present

## 2022-03-12 MED ORDER — PREDNISONE 20 MG PO TABS: ORAL_TABLET | ORAL | 0 refills | Status: AC

## 2022-03-12 NOTE — Assessment & Plan Note (Signed)
Improved with devotional's and mindfulness.  Return if worsening.

## 2022-03-12 NOTE — Assessment & Plan Note (Signed)
Previously with persistent diarrhea, this is now improved she is no longer taking Prevalite.  But she is feeling bloated and getting occasional constipation.  She has recently started a high-fiber diet.  Handout for constipation provided discussed that she also may be presenting with some symptoms of IBD handout for low FODMAP diet provided.  Encouraged her to try Colace instead of MiraLAX that she noted she had significant loose stools with 1 dose of MiraLAX.  She is following with GI appreciate support.  Discussed constipation as challenging to treat and ultimately is about trial and error to find what works for her.

## 2022-03-12 NOTE — Assessment & Plan Note (Signed)
Bilateral numbness in both hands for a few years.  She has not tried physical therapy, referral placed today.  [place] exam is positive we will do a trial of steroids.  Discussed if not improving to follow-up with Dr. Patsy Lager for consideration for MRI.

## 2022-03-12 NOTE — Patient Instructions (Addendum)
Neck symptoms/numbness - steroid - no ibuprofen or aleve while taking - physically therapy  - return in 6 weeks if no improvement to see me or Dr. Patsy Lager   Constipation   Constipation is a common issue. Often it is related to diet and occasionally medications.    What you can do to treat your symptoms 1) Fiber -- Eat more fiber rich foods: beans, broccoli, berries, avocados, popcorn, pear/apple, green peas, turnip greens, brussels sprouts, whole grains (barley, bran, quinoa, oatmeal) -- Take a Fiber supplement: Psyllium (Metamucil)  -- Could also eat Prunes daily  2) Hydration  -- Drink more water: Try to drink 64 oz of water per day  3) Exercise -- Moderate exercise (walking, jogging, biking) for 30 minutes, 5 days a week  4) Dedicate time for Bowel movements - do not delay  5) Stool Softener  - Docusate Sodium (Colace) 100 mg daily or twice daily as needed   If 4-6 weeks have passed and the above has not helped then start the following 6) Laxatives -- Polyethylene Glycol (Miralax) - begin with once daily. After a few days can increase to twice daily Or -- Magnesium Citrate -- Common side effect is nausea and diarrhea -- can try if still not improved    Low-FODMAP Eating Plan  FODMAP stands for fermentable oligosaccharides, disaccharides, monosaccharides, and polyols. These are sugars that are hard for some people to digest. A low-FODMAP eating plan may help some people who have irritable bowel syndrome (IBS) and certain other bowel (intestinal) diseases to manage their symptoms. This meal plan can be complicated to follow. Work with a diet and nutrition specialist (dietitian) to make a low-FODMAP eating plan that is right for you. A dietitian can help make sure that you get enough nutrition from this diet. What are tips for following this plan? Reading food labels Check labels for hidden FODMAPs such as: High-fructose syrup. Honey. Agave. Natural fruit  flavors. Onion or garlic powder. Choose low-FODMAP foods that contain 3-4 grams of fiber per serving. Check food labels for serving sizes. Eat only one serving at a time to make sure FODMAP levels stay low. Shopping Shop with a list of foods that are recommended on this diet and make a meal plan. Meal planning Follow a low-FODMAP eating plan for up to 6 weeks, or as told by your health care provider or dietitian. To follow the eating plan: Eliminate high-FODMAP foods from your diet completely. Choose only low-FODMAP foods to eat. You will do this for 2-6 weeks. Gradually reintroduce high-FODMAP foods into your diet one at a time. Most people should wait a few days before introducing the next new high-FODMAP food into their meal plan. Your dietitian can recommend how quickly you may reintroduce foods. Keep a daily record of what and how much you eat and drink. Make note of any symptoms that you have after eating. Review your daily record with a dietitian regularly to identify which foods you can eat and which foods you should avoid. General tips Drink enough fluid each day to keep your urine pale yellow. Avoid processed foods. These often have added sugar and may be high in FODMAPs. Avoid most dairy products, whole grains, and sweeteners. Work with a dietitian to make sure you get enough fiber in your diet. Avoid high FODMAP foods at meals to manage symptoms. Recommended foods Fruits Bananas, oranges, tangerines, lemons, limes, blueberries, raspberries, strawberries, grapes, cantaloupe, honeydew melon, kiwi, papaya, passion fruit, and pineapple. Limited amounts of dried cranberries,  banana chips, and shredded coconut. Vegetables Eggplant, zucchini, cucumber, peppers, green beans, bean sprouts, lettuce, arugula, kale, Swiss chard, spinach, collard greens, bok choy, summer squash, potato, and tomato. Limited amounts of corn, carrot, and sweet potato. Green parts of scallions. Grains Gluten-free  grains, such as rice, oats, buckwheat, quinoa, corn, polenta, and millet. Gluten-free pasta, bread, or cereal. Rice noodles. Corn tortillas. Meats and other proteins Unseasoned beef, pork, poultry, or fish. Eggs. Tomasa Blase. Tofu (firm) and tempeh. Limited amounts of nuts and seeds, such as almonds, walnuts, [place] nuts, pecans, peanuts, nut butters, pumpkin seeds, chia seeds, and sunflower seeds. Dairy Lactose-free milk, yogurt, and kefir. Lactose-free cottage cheese and ice cream. Non-dairy milks, such as almond, coconut, hemp, and rice milk. Non-dairy yogurt. Limited amounts of goat cheese, brie, mozzarella, parmesan, swiss, and other hard cheeses. Fats and oils Butter-free spreads. Vegetable oils, such as olive, canola, and sunflower oil. Seasoning and other foods Artificial sweeteners with names that do not end in "ol," such as aspartame, saccharine, and stevia. Maple syrup, white table sugar, raw sugar, brown sugar, and molasses. Mayonnaise, soy sauce, and tamari. Fresh basil, coriander, parsley, rosemary, and thyme. Beverages Water and mineral water. Sugar-sweetened soft drinks. Small amounts of orange juice or cranberry juice. Black and green tea. Most dry wines. Coffee. The items listed above may not be a complete list of foods and beverages you can eat. Contact a dietitian for more information. Foods to avoid Fruits Fresh, dried, and juiced forms of apple, pear, watermelon, peach, plum, cherries, apricots, blackberries, boysenberries, figs, nectarines, and mango. Avocado. Vegetables Chicory root, artichoke, asparagus, cabbage, snow peas, Brussels sprouts, broccoli, sugar snap peas, mushrooms, celery, and cauliflower. Onions, garlic, leeks, and the white part of scallions. Grains Wheat, including kamut, durum, and semolina. Barley and bulgur. Couscous. Wheat-based cereals. Wheat noodles, bread, crackers, and pastries. Meats and other proteins Fried or fatty meat. Sausage. Cashews and  pistachios. Soybeans, baked beans, black beans, chickpeas, kidney beans, fava beans, navy beans, lentils, black-eyed peas, and split peas. Dairy Milk, yogurt, ice cream, and soft cheese. Cream and sour cream. Milk-based sauces. Custard. Buttermilk. Soy milk. Seasoning and other foods Any sugar-free gum or candy. Foods that contain artificial sweeteners such as sorbitol, mannitol, isomalt, or xylitol. Foods that contain honey, high-fructose corn syrup, or agave. Bouillon, vegetable stock, beef stock, and chicken stock. Garlic and onion powder. Condiments made with onion, such as hummus, chutney, pickles, relish, salad dressing, and salsa. Tomato paste. Beverages Chicory-based drinks. Coffee substitutes. Chamomile tea. Fennel tea. Sweet or fortified wines such as port or sherry. Diet soft drinks made with isomalt, mannitol, maltitol, sorbitol, or xylitol. Apple, pear, and mango juice. Juices with high-fructose corn syrup. The items listed above may not be a complete list of foods and beverages you should avoid. Contact a dietitian for more information. Summary FODMAP stands for fermentable oligosaccharides, disaccharides, monosaccharides, and polyols. These are sugars that are hard for some people to digest. A low-FODMAP eating plan is a short-term diet that helps to ease symptoms of certain bowel diseases. The eating plan usually lasts up to 6 weeks. After that, high-FODMAP foods are reintroduced gradually and one at a time. This can help you find out which foods may be causing symptoms. A low-FODMAP eating plan can be complicated. It is best to work with a dietitian who has experience with this type of plan. This information is not intended to replace advice given to you by your health care provider. Make sure you discuss any questions you  have with your health care provider. Document Revised: 12/07/2019 Document Reviewed: 12/07/2019 Elsevier Patient Education  2023 [employer].

## 2022-03-12 NOTE — Progress Notes (Signed)
Subjective:     Kathy Garcia is a 56 y.o. female presenting for Follow-up (4 week dep/anx/Pt has not started taking 300mg  of bupropion yet.)     HPI  #Depression/anxiety - has been very frustrated with her health lately  - and things just not going right - never started the medication  - has been doing devotions    Saw Dr. with GI - was given a high fiber diet - told she has an inflamed colon - has not started back on the cholestyramine  - feels very bloated whenever she eats - had 3 large bowel movements this morning  - was having issues getting stools  - no bm for 2 days and was having significant pain - now she has had several formed stools - ended up not taking anything to move her bowels - drinking 1 gallon of water per day - yesterday tea or body armor  - no soda - has noticed constipation since starting eating regularly again   Has gained 10 lbs in 3 days Has not eaten much    Review of Systems   Social History   Tobacco Use  Smoking Status Every Day   Packs/day: 0.50   Years: 25.00   Total pack years: 12.50   Types: Cigarettes  Smokeless Tobacco Never  Tobacco Comments   1 ppd x 20 years, quit for 10 years, restarted 2018        Objective:    BP Readings from Last 3 Encounters:  03/12/22 100/60  03/03/22 109/70  02/12/22 122/70   Wt Readings from Last 3 Encounters:  03/12/22 144 lb 8 oz (65.5 kg)  03/03/22 138 lb 12.8 oz (63 kg)  02/12/22 139 lb 2 oz (63.1 kg)    BP 100/60   Pulse 82   Temp 97.7 F (36.5 C) (Temporal)   Wt 144 lb 8 oz (65.5 kg)   LMP 12/01/2012 (Exact Date)   SpO2 98%   BMI 24.80 kg/m    Physical Exam Constitutional:      General: She is not in acute distress.    Appearance: She is well-developed. She is not diaphoretic.  HENT:     Right Ear: External ear normal.     Left Ear: External ear normal.     Nose: Nose normal.  Eyes:     Conjunctiva/sclera: Conjunctivae normal.  Cardiovascular:      Rate and Rhythm: Normal rate and regular rhythm.     Heart sounds: No murmur heard. Pulmonary:     Effort: Pulmonary effort is normal. No respiratory distress.     Breath sounds: Normal breath sounds. No wheezing.  Abdominal:     General: Abdomen is flat. Bowel sounds are normal. There is no distension.     Palpations: Abdomen is soft.     Tenderness: There is abdominal tenderness (generalized, worse on the RLQ). There is no guarding or rebound.  Musculoskeletal:     Cervical back: Neck supple.     Comments: Positive spurling b/l  Skin:    General: Skin is warm and dry.     Capillary Refill: Capillary refill takes less than 2 seconds.  Neurological:     Mental Status: She is alert. Mental status is at baseline.  Psychiatric:        Mood and Affect: Mood normal.        Behavior: Behavior normal.        03/12/2022   12:19 PM 02/12/2022    9:28  AM 03/03/2021   11:27 AM  Depression screen PHQ 2/9  Decreased Interest 1 3 0  Down, Depressed, Hopeless 0 3 0  PHQ - 2 Score 1 6 0  Altered sleeping 1 3 3   Tired, decreased energy 2 3 3   Change in appetite 1 1 3   Feeling bad or failure about yourself  0 3 0  Trouble concentrating 1 3 1   Moving slowly or fidgety/restless 1 3 0  Suicidal thoughts 0 0 0  PHQ-9 Score 7 22 10   Difficult doing work/chores Somewhat difficult Somewhat difficult Somewhat difficult      03/12/2022   12:19 PM 02/12/2022    9:28 AM 06/13/2020   12:40 PM  GAD 7 : Generalized Anxiety Score  Nervous, Anxious, on Edge 1 3 2   Control/stop worrying 0 3 3  Worry too much - different things 1 3 2   Trouble relaxing 1 3 2   Restless 0 2 1  Easily annoyed or irritable 1 3 3   Afraid - awful might happen 0 2 1  Total GAD 7 Score 4 19 14   Anxiety Difficulty Somewhat difficult Somewhat difficult Very difficult          Assessment & Plan:   Problem List Items Addressed This Visit       Nervous and Auditory   Cervical neuropathy - Primary    Bilateral numbness  in both hands for a few years.  She has not tried physical therapy, referral placed today.  [place] exam is positive we will do a trial of steroids.  Discussed if not improving to follow-up with Dr. for consideration for MRI.      Relevant Medications   predniSONE (DELTASONE) 20 MG tablet   Other Relevant Orders   Ambulatory referral to Physical Therapy     Other   Generalized anxiety disorder    Improved with devotional's and mindfulness.  Return if worsening.      Major depressive disorder    Improved, she did not start medication.  She has been doing devotional's and mindfulness.  Continue to monitor.      Constipation    Previously with persistent diarrhea, this is now improved she is no longer taking Prevalite.  But she is feeling bloated and getting occasional constipation.  She has recently started a high-fiber diet.  Handout for constipation provided discussed that she also may be presenting with some symptoms of IBD handout for low FODMAP diet provided.  Encouraged her to try Colace instead of MiraLAX that she noted she had significant loose stools with 1 dose of MiraLAX.  She is following with GI appreciate support.  Discussed constipation as challenging to treat and ultimately is about trial and error to find what works for her.      I spent 45 minutes with pt , obtaining history, examining, reviewing chart, documenting encounter and discussing the above plan of care.   Return in about 6 weeks (around 04/23/2022).  05/12/2022, MD

## 2022-03-12 NOTE — Assessment & Plan Note (Signed)
Improved, she did not start medication.  She has been doing devotional's and mindfulness.  Continue to monitor.

## 2022-04-01 ENCOUNTER — Encounter
Admission: RE | Disposition: A | Discharge status: Home / Self Care | Payer: Self-pay | Source: Home / Self Care | Attending: Gastroenterology

## 2022-04-01 ENCOUNTER — Ambulatory Visit: Payer: No Typology Code available for payment source | Admitting: Anesthesiology

## 2022-04-01 ENCOUNTER — Ambulatory Visit
Admission: RE | Admit: 2022-04-01 | Discharge: 2022-04-01 | Disposition: A | Discharge status: Home / Self Care | Payer: No Typology Code available for payment source | Attending: Gastroenterology | Admitting: Gastroenterology

## 2022-04-01 DIAGNOSIS — K449 Diaphragmatic hernia without obstruction or gangrene: Secondary | ICD-10-CM | POA: Insufficient documentation

## 2022-04-01 DIAGNOSIS — R1084 Generalized abdominal pain: Secondary | ICD-10-CM | POA: Diagnosis not present

## 2022-04-01 DIAGNOSIS — R109 Unspecified abdominal pain: Secondary | ICD-10-CM | POA: Diagnosis not present

## 2022-04-01 DIAGNOSIS — K2270 Barrett's esophagus without dysplasia: Secondary | ICD-10-CM | POA: Insufficient documentation

## 2022-04-01 DIAGNOSIS — F17210 Nicotine dependence, cigarettes, uncomplicated: Secondary | ICD-10-CM | POA: Insufficient documentation

## 2022-04-01 DIAGNOSIS — K2289 Other specified disease of esophagus: Secondary | ICD-10-CM | POA: Diagnosis present

## 2022-04-01 DIAGNOSIS — K9089 Other intestinal malabsorption: Secondary | ICD-10-CM

## 2022-04-01 DIAGNOSIS — R634 Abnormal weight loss: Secondary | ICD-10-CM

## 2022-04-01 HISTORY — PX: ESOPHAGOGASTRODUODENOSCOPY (EGD) WITH PROPOFOL: SHX5813

## 2022-04-01 SURGERY — ESOPHAGOGASTRODUODENOSCOPY (EGD) WITH PROPOFOL
Anesthesia: General

## 2022-04-01 MED ORDER — OMEPRAZOLE MAGNESIUM 20 MG PO TBEC: 20.0000 mg | DELAYED_RELEASE_TABLET | Freq: Two times a day (BID) | ORAL | 2 refills | Status: DC

## 2022-04-01 MED ORDER — SODIUM CHLORIDE 0.9 % IV SOLN
INTRAVENOUS | Status: DC
  Administered 2022-04-01: 20 mL/h via INTRAVENOUS

## 2022-04-01 MED ORDER — MIDAZOLAM HCL 2 MG/2ML IJ SOLN
INTRAMUSCULAR | Status: DC | PRN
  Administered 2022-04-01: 2 mg via INTRAVENOUS

## 2022-04-01 MED ORDER — PROPOFOL 10 MG/ML IV BOLUS
INTRAVENOUS | Status: DC | PRN
  Administered 2022-04-01: 60 mg via INTRAVENOUS

## 2022-04-01 MED ORDER — SODIUM CHLORIDE 0.9 % IV SOLN: INTRAVENOUS | Status: DC | PRN

## 2022-04-01 MED ORDER — KETAMINE HCL 10 MG/ML IJ SOLN
INTRAMUSCULAR | Status: DC | PRN
  Administered 2022-04-01: 20 mg via INTRAVENOUS

## 2022-04-01 MED ORDER — MIDAZOLAM HCL 2 MG/2ML IJ SOLN
INTRAMUSCULAR | Status: AC
  Filled 2022-04-01: qty 2

## 2022-04-01 MED ORDER — KETAMINE HCL 50 MG/5ML IJ SOSY
PREFILLED_SYRINGE | INTRAMUSCULAR | Status: AC
  Filled 2022-04-01: qty 5

## 2022-04-01 MED ORDER — PROPOFOL 500 MG/50ML IV EMUL
INTRAVENOUS | Status: DC | PRN
  Administered 2022-04-01: 125 ug/kg/min via INTRAVENOUS

## 2022-04-01 NOTE — Anesthesia Preprocedure Evaluation (Addendum)
Anesthesia Evaluation  Patient identified by MRN, date of birth, ID band Patient awake    Reviewed: Allergy & Precautions, NPO status , Patient's Chart, lab work & pertinent test results  Airway Mallampati: II  TM Distance: >3 FB Neck ROM: full    Dental no notable dental hx.    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    GI/Hepatic Neg liver ROS, bile salt-induced diarrhea   Endo/Other  negative endocrine ROS  Renal/GU negative Renal ROS  negative genitourinary   Musculoskeletal   Abdominal   Peds  Hematology  (+) Blood dyscrasia, anemia ,   Anesthesia Other Findings Past Medical History: No date: Anemia No date: Anxiety No date: Arthritis No date: Asthma No date: Cervical dysplasia No date: Chronic abdominal pain No date: Chronic diarrhea     Comment:  takes prevalite No date: Depression No date: Diverticulosis No date: GERD (gastroesophageal reflux disease) No date: Seasonal allergies No date: Wears glasses  Past Surgical History: 12/16/2021: CERVICAL CONIZATION W/BX; N/A     Comment:  Procedure: LEEP  CONIZATION CERVIX WITH BIOPSY;                Surgeon: Reva Bores, MD;  Location: [facility];  Service: Gynecology;  Laterality: N/A; 1995: CERVIX LESION DESTRUCTION 01/04/2013: CHOLECYSTECTOMY, LAPAROSCOPIC     Comment:  @[facility]  03/27/2021: COLONOSCOPY WITH PROPOFOL; N/A     Comment:  Procedure: COLONOSCOPY WITH PROPOFOL;  Surgeon: 03/29/2021, MD;  Location: [facility] ENDOSCOPY;  Service:               Gastroenterology;  Laterality: N/A; 11/12/2021: PR ENDOMET BIOPSY DONE W/COLPOSCOPY 1972:  TONSILLECTOMY 1996: TUBAL LIGATION; Bilateral     Reproductive/Obstetrics negative OB ROS                           Anesthesia Physical Anesthesia Plan  ASA: 2  Anesthesia Plan: General   Post-op Pain Management: Minimal or no pain anticipated   Induction: Intravenous  PONV Risk Score and Plan: Propofol infusion and TIVA  Airway Management Planned: Natural Airway  Additional Equipment:   Intra-op Plan:   Post-operative Plan:   Informed Consent: I have reviewed the patients History and Physical, chart, labs and discussed the procedure including the risks, benefits and alternatives for the proposed anesthesia with the patient or authorized representative who has indicated his/her understanding and acceptance.     Dental advisory given  Plan Discussed with: Anesthesiologist, CRNA and Surgeon  Anesthesia Plan Comments:        Anesthesia Quick Evaluation

## 2022-04-01 NOTE — H&P (Signed)
Wyline Mood, MD [address], [place], [place], [address], [address], [place], [place], [address] Phone: [phone]  Fax: [phone]  Primary Care Physician:  Lynnda Child, MD   Pre-Procedure History & Physical: HPI:  Kathy Garcia is a 56 y.o. female is here for an endoscopy    Past Medical History:  Diagnosis Date   Anemia    Anxiety    Arthritis    Asthma    Cervical dysplasia    Chronic abdominal pain    Chronic diarrhea    takes prevalite   Depression    Diverticulosis    GERD (gastroesophageal reflux disease)    Seasonal allergies    Wears glasses     Past Surgical History:  Procedure Laterality Date   CERVICAL CONIZATION W/BX N/A 12/16/2021   Procedure: LEEP  CONIZATION CERVIX WITH BIOPSY;  Surgeon: Reva Bores, MD;  Location: [facility];  Service: Gynecology;  Laterality: N/A;   CERVIX LESION DESTRUCTION  1995   CHOLECYSTECTOMY, LAPAROSCOPIC  01/04/2013   @[facility]    COLONOSCOPY WITH PROPOFOL N/A 03/27/2021   Procedure: COLONOSCOPY WITH PROPOFOL;  Surgeon: 03/29/2021, MD;  Location: [facility] ENDOSCOPY;  Service: Gastroenterology;  Laterality: N/A;   PR ENDOMET BIOPSY DONE W/COLPOSCOPY  11/12/2021   TONSILLECTOMY  1972   TUBAL LIGATION Bilateral 1996    Prior to Admission medications   Medication Sig Start Date End Date Taking? Authorizing Provider  acetaminophen (TYLENOL) 500 MG tablet Take 500 mg by mouth every 6 (six) hours as needed.   Yes [provider]  albuterol (VENTOLIN HFA) 108 (90 Base) MCG/ACT inhaler Inhale 2 puffs into the lungs every 4 (four) hours as needed for wheezing. 06/13/20  Yes 13/11/21, MD  Cholecalciferol (VITAMIN D-3 PO) Take by mouth daily.   Yes [provider]  cholestyramine light (PREVALITE) 4 g packet Take 1 packet (4 g total) by mouth daily. 03/03/22 03/31/23 Yes 04/02/23, MD  Cyanocobalamin (B-12 PO) Take by mouth daily.   Yes [provider]   Multiple Vitamin (MULTIVITAMIN) tablet Take 1 tablet by mouth daily.   Yes [provider]  ondansetron (ZOFRAN-ODT) 4 MG disintegrating tablet TAKE ONE TABLET EVERY EIGHT HOURS AS NEEDED FOR NAUSEA AND VOMITING 06/13/21  Yes 13/11/22, MD    Allergies as of 03/03/2022 - Review Complete 03/03/2022  Allergen Reaction Noted   Hydrocodone Nausea And Vomiting 01/03/2013    Family History  Problem Relation Age of Onset   Hypertension Mother    Alzheimer's disease Mother    Diverticulitis Mother    Cancer Father        unknown type   Colon cancer Maternal Aunt    Heart failure Maternal Uncle    Stomach cancer Paternal Aunt    Breast cancer Maternal Aunt    Brain cancer Paternal Aunt    Breast cancer Paternal Aunt     Social History   Socioeconomic History   Marital status: Married    Spouse name: 03/05/2013   Number of children: 2   Years of education: some college   Highest education level: Not on file  Occupational History   Not on file  Tobacco Use   Smoking status: Every Day    Packs/day: 0.50    Years: 25.00    Total pack years: 12.50    Types: Cigarettes   Smokeless tobacco: Never   Tobacco comments:    1 ppd x  20 years, quit for 10 years, restarted 2018  Vaping Use   Vaping Use: Some days   Devices: per pt every few weeks , vapes whatever her son has at the time  Substance and Sexual Activity   Alcohol use: Not Currently    Comment: none since 2018   Drug use: Never   Sexual activity: Yes    Birth control/protection: Post-menopausal, Surgical  Other Topics Concern   Not on file  Social History Narrative   06/13/20   From: the area   Living: with husband Sheria Lang (2019), with husband's cousin Film/video editor   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      Family: 2 Scientist, research (medical) and Doctor, general practice - 3 grandchildren      Enjoys: writing, drawing, playing with cats, time with grandchildren      Exercise: not currently   Diet: nausea impacts diet       Safety   Seat belts: Yes    Guns: Yes  and secure   Safe in relationships: Yes    Social Determinants of Health   Financial Resource Strain: Not on file  Food Insecurity: Not on file  Transportation Needs: Not on file  Physical Activity: Not on file  Stress: Not on file  Social Connections: Not on file  Intimate Partner Violence: Not on file    Review of Systems: See HPI, otherwise negative ROS  Physical Exam: BP 126/76   Pulse 81   Temp 97.7 F (36.5 C) (Temporal)   Resp 20   Ht 5\' 3"  (1.6 m)   Wt 63.5 kg   LMP 12/01/2012 (Exact Date)   SpO2 97%   BMI 24.80 kg/m  General:   Alert,  pleasant and cooperative in NAD Head:  Normocephalic and atraumatic. Neck:  Supple; no masses or thyromegaly. Lungs:  Clear throughout to auscultation, normal respiratory effort.    Heart:  +S1, +S2, Regular rate and rhythm, No edema. Abdomen:  Soft, nontender and nondistended. Normal bowel sounds, without guarding, and without rebound.   Neurologic:  Alert and  oriented x4;  grossly normal neurologically.  Impression/Plan: Kathy Garcia is here for an endoscopy  to be performed for  evaluation of abdominal pain    Risks, benefits, limitations, and alternatives regarding endoscopy have been reviewed with the patient.  Questions have been answered.  All parties agreeable.   Virgie Dad, MD  04/01/2022, 9:42 AM

## 2022-04-01 NOTE — Op Note (Signed)
[facility] Gastroenterology Patient Name: Kathy Garcia Procedure Date: 04/01/2022 9:38 AM MRN: [id number] Account #: [id number] Date of Birth: [birth year] Admit Type: Outpatient Age: 56 Room: [facility] ENDO ROOM 3 Gender: Female Note Status: Finalized Instrument Name: Patton Salles Endoscope [id number] Procedure:             Upper GI endoscopy Indications:           Abdominal pain Providers:             Wyline Mood MD, MD Medicines:             Monitored Anesthesia Care Complications:         No immediate complications. Procedure:             Pre-Anesthesia Assessment:                        - Prior to the procedure, a History and Physical was                         performed, and patient medications, allergies and                         sensitivities were reviewed. The patient's tolerance                         of previous anesthesia was reviewed.                        - The risks and benefits of the procedure and the                         sedation options and risks were discussed with the                         patient. All questions were answered and informed                         consent was obtained.                        - ASA Grade Assessment: II - A patient with mild                         systemic disease.                        After obtaining informed consent, the endoscope was                         passed under direct vision. Throughout the procedure,                         the patient's blood pressure, pulse, and oxygen                         saturations were monitored continuously. The Endoscope                         was introduced through the mouth, and advanced to the  third part of duodenum. The upper GI endoscopy was                         accomplished with ease. The patient tolerated the                         procedure well. Findings:      Circumferential salmon-colored mucosa was present from 30 to 35 cm. No        other visible abnormalities were present. The maximum longitudinal       extent of these esophageal mucosal changes was 5 cm in length. Biopsies       were taken with a cold forceps for histology.      The examined duodenum was normal.      A medium-sized hiatal hernia was present.      The cardia and gastric fundus were normal on retroflexion.      The examined duodenum was normal. Biopsies were taken with a cold       forceps for histology. Impression:            - Salmon-colored mucosa suspicious for long-segment                         Barrett's esophagus. Biopsied.                        - Normal examined duodenum.                        - Medium-sized hiatal hernia.                        - Normal examined duodenum. Biopsied. Recommendation:        - Await pathology results.                        - Discharge patient to home (with escort).                        - Resume previous diet.                        - Continue present medications.                        - Return to my office as previously scheduled. Procedure Code(s):     --- Professional ---                        [phone], Esophagogastroduodenoscopy, flexible,                         transoral; with biopsy, single or multiple Diagnosis Code(s):     --- Professional ---                        K22.8, Other specified diseases of esophagus                        K44.9, Diaphragmatic hernia without obstruction or  gangrene                        R10.9, Unspecified abdominal pain CPT copyright 2019 American Medical Association. All rights reserved. The codes documented in this report are preliminary and upon coder review may  be revised to meet current compliance requirements. Wyline Mood, MD Wyline Mood MD, MD 04/01/2022 10:00:08 AM This report has been signed electronically. Number of Addenda: 0 Note Initiated On: 04/01/2022 9:38 AM Estimated Blood Loss:  Estimated blood loss: none.      [facility]

## 2022-04-01 NOTE — Transfer of Care (Signed)
Immediate Anesthesia Transfer of Care Note  Patient: Kathy Garcia  Procedure(s) Performed: ESOPHAGOGASTRODUODENOSCOPY (EGD) WITH PROPOFOL  Patient Location: PACU and Endoscopy Unit  Anesthesia Type:MAC  Level of Consciousness: drowsy  Airway & Oxygen Therapy: Patient Spontanous Breathing  Post-op Assessment: Report given to RN and Post -op Vital signs reviewed and stable  Post vital signs: Reviewed and stable  Last Vitals:  Vitals Value Taken Time  BP 119/71 04/01/22 1002  Temp 36.4 C 04/01/22 1002  Pulse 71 04/01/22 1002  Resp 22 04/01/22 1002  SpO2 100 % 04/01/22 1002    Last Pain:  Vitals:   04/01/22 1002  TempSrc: Temporal  PainSc: Asleep         Complications: No notable events documented.

## 2022-04-01 NOTE — Anesthesia Postprocedure Evaluation (Signed)
Anesthesia Post Note  Patient: NYOMI HOWSER  Procedure(s) Performed: ESOPHAGOGASTRODUODENOSCOPY (EGD) WITH PROPOFOL  Patient location during evaluation: Endoscopy Anesthesia Type: General Level of consciousness: awake and alert Pain management: pain level controlled Vital Signs Assessment: post-procedure vital signs reviewed and stable Respiratory status: spontaneous breathing, nonlabored ventilation and respiratory function stable Cardiovascular status: blood pressure returned to baseline and stable Postop Assessment: no apparent nausea or vomiting Anesthetic complications: no   No notable events documented.   Last Vitals:  Vitals:   04/01/22 1002 04/01/22 1012  BP: 119/71 131/81  Pulse: 71 73  Resp: (!) 22 20  Temp: 36.4 C   SpO2: 100% 100%    Last Pain:  Vitals:   04/01/22 1022  TempSrc:   PainSc: 0-No pain                 Foye Deer

## 2022-04-02 ENCOUNTER — Encounter: Payer: Self-pay | Admitting: Family Medicine

## 2022-04-02 ENCOUNTER — Ambulatory Visit: Payer: Self-pay | Admitting: Psychiatry

## 2022-04-02 ENCOUNTER — Encounter: Payer: Self-pay | Admitting: Gastroenterology

## 2022-04-02 DIAGNOSIS — K2270 Barrett's esophagus without dysplasia: Secondary | ICD-10-CM | POA: Insufficient documentation

## 2022-04-02 LAB — SURGICAL PATHOLOGY

## 2022-04-03 NOTE — Progress Notes (Signed)
Biopsies show Barrett's esophagus.  Send her information about Barrett's esophagus ensure she is taking her PPI.  We will discuss further at next visit repeat upper endoscopy in 3 years for surveillance

## 2022-04-08 ENCOUNTER — Telehealth: Payer: Self-pay

## 2022-04-08 NOTE — Telephone Encounter (Signed)
-----   Message from Wyline Mood, MD sent at 04/03/2022 10:33 AM EDT ----- Biopsies show Barrett's esophagus.  Send her information about Barrett's esophagus ensure she is taking her PPI.  We will discuss further at next visit repeat upper endoscopy in 3 years for surveillance

## 2022-04-08 NOTE — Telephone Encounter (Signed)
Called patient but had to leave her a detailed message letting her know the below information. Patient was reminded to continue taking her Omeprazole 20 MG BID and to follow up with Dr. Tobi Bastos as scheduled. Lastly, she was also mentioned that I would be placing her in our recall list in 3 years to repeat her EGD for surveillance. I asked to call me back if she had further questions.   Will also mail her information about barrette's esophagus for her to read and understand by mail.

## 2022-04-23 ENCOUNTER — Ambulatory Visit (INDEPENDENT_AMBULATORY_CARE_PROVIDER_SITE_OTHER)
Admission: RE | Admit: 2022-04-23 | Discharge: 2022-04-23 | Disposition: A | Discharge status: Home / Self Care | Payer: No Typology Code available for payment source | Source: Ambulatory Visit | Attending: Family Medicine | Admitting: Family Medicine

## 2022-04-23 ENCOUNTER — Ambulatory Visit (INDEPENDENT_AMBULATORY_CARE_PROVIDER_SITE_OTHER): Payer: No Typology Code available for payment source | Admitting: Family Medicine

## 2022-04-23 ENCOUNTER — Encounter: Payer: Self-pay | Admitting: Family Medicine

## 2022-04-23 VITALS — BP 120/70 | HR 73 | Temp 98.0°F | Ht 63.5 in | Wt 148.5 lb

## 2022-04-23 DIAGNOSIS — M7712 Lateral epicondylitis, left elbow: Secondary | ICD-10-CM | POA: Diagnosis not present

## 2022-04-23 DIAGNOSIS — R200 Anesthesia of skin: Secondary | ICD-10-CM

## 2022-04-23 DIAGNOSIS — M5412 Radiculopathy, cervical region: Secondary | ICD-10-CM

## 2022-04-23 DIAGNOSIS — M545 Low back pain, unspecified: Secondary | ICD-10-CM

## 2022-04-23 MED ORDER — PREDNISONE 20 MG PO TABS: ORAL_TABLET | ORAL | 0 refills | Status: DC

## 2022-04-23 MED ORDER — DICLOFENAC SODIUM 1 % EX GEL
4.0000 g | Freq: Four times a day (QID) | CUTANEOUS | 11 refills | Status: DC
Start: 2022-04-23 — End: 2022-12-23

## 2022-04-23 MED ORDER — TRIAMCINOLONE ACETONIDE 40 MG/ML IJ SUSP
20.0000 mg | Freq: Once | INTRAMUSCULAR | Status: AC
  Administered 2022-04-23: 20 mg via INTRA_ARTICULAR

## 2022-04-23 NOTE — Progress Notes (Signed)
Amany Rando T. Pankaj Haack, MD, [place] at [facility] [place], [address]  Phone: [phone]  FAX: [phone]  Kathy Garcia - 56 y.o. female  MRN [id number]  Date of Birth: 09/02/1965  Date: 04/23/2022  PCP: Lesleigh Noe, MD  Referral: Lesleigh Noe, MD  Chief Complaint  Patient presents with   Cervical Neuropathy   Elbow Pain    Left    Hand Numbness    Bilateral   Chest Wall Pain    Hurts behind right breast when she take a deep breath    Gait Issues   Subjective:   Kathy Garcia is a 56 y.o. very pleasant female patient with Body mass index is 25.89 kg/m. who presents with the following:  Patient is a new consultation for Dr. Einar Pheasant, seen by myself for cervical radiculopathy.  She was seen 6 weeks ago, started on formal physical therapy, she was given a long taper of some prednisone -but she decided to hold off on doing any physical therapy and hold off on doing any prednisone until she came to talk to me..  She is here today in follow-up.  Hands have been numb for a very long time, and they will throb and hurt all of the time.   Will have to walk around and hang down.  Something is going on with her neck.  Woke up in pain and hurt to turn her head on both sides.  -She has neck pain with movement essentially of all types and in all directions with movement of her head and neck she does have terminal pain.  She is able to induce some radicular symptoms down the left arm with neck motion by her self.  She does not have any pain in and about the wrist joints.  L elbow is also hurting.  Has a stabbing constant pain.  Had distant tennis elbow.  Pain localizes to the lateral epicondyle on the left.  She is having trouble fully extending her arm, making a grip, she is sometimes dropping things at work.  In February, was unloading a washing machine, and she has been having pain going down her neck on the L  side.   Self-spurling's on the L side.   She also has been having intermittent back pain off and on for months to years.  She does relate a story of her prior trauma greater than 5 years ago where she had a major fall and accident, and since then she has been having some intermittent back pain.  Traps Neck Rad on the l, cervical   Inject l elbow  34 mins  Review of Systems is noted in the HPI, as appropriate  Objective:   BP 120/70   Pulse 73   Temp 98 F (36.7 C) (Oral)   Ht 5' 3.5" (1.613 m)   Wt 148 lb 8 oz (67.4 kg)   LMP 12/01/2012 (Exact Date)   SpO2 97%   BMI 25.89 kg/m    GEN: alert,appropriate PSYCH: Normally interactive. Cooperative during the interview.   CERVICAL SPINE EXAM Range of motion: Flexion, extension, lateral bending, and rotation: Roughly 35% loss of motion in all directions Pain with terminal motion: Yes in all directions Spinous Processes: Some tenderness C5-C7 SCM: diffuse tenderness Upper paracervical muscles: diffuse tenderness Upper traps: B tenderness C5-T1 intact, sensation and motor   Tinel's and phalen's at the wrist is negative  Lumbar spine is mildly tender  from L2-S1 bilaterally.  She is able to forward flex, extend, laterally bend and rotate normally without any kind of inhibition of motion. Lower extremities entirely neurovascularly intact.  Elbow: L Ecchymosis or edema: neg ROM: full flexion, extension, pronation, supination Shoulder ROM: Full Flexion: 5/5 Extension: 5/5, PAINFUL difficult to fully extend at the elbow Supination: 5/5, PAINFUL Pronation: 5/5 Wrist ext: 5/5 Wrist flexion: 5/5 No gross bony abnormality Varus and Valgus stress: stable ECRB tenderness: YES, TTP Medial epicondyle: NT Lateral epicondyle, resisted wrist extension from wrist full pronation and flexion: PAINFUL grip: 5/5  sensation intact Tinel's, Elbow: negative   Laboratory and Imaging Data:  Assessment and Plan:     ICD-10-CM   1.  Cervical radiculopathy, acute  M54.12 Ambulatory referral to Physical Therapy    DG Cervical Spine Complete    2. Lateral epicondylitis of left elbow  M77.12 Ambulatory referral to Physical Therapy    triamcinolone acetonide (KENALOG-40) injection 20 mg    3. Low back pain of over 3 months duration  M54.50 Ambulatory referral to Physical Therapy    DG Lumbar Spine Complete    4. Bilateral hand numbness  R20.0      Think the primary problem here with her arms, neck or dry from the cervical spine with probable nerve encroachment bilaterally plus or minus spinal stenosis and foraminal stenosis.  Do not suspect wrist or carpal tunnel pathology, and I think that her neck is quite tender, lacks range of motion, and she is having radicular symptoms down both sides driven from her neck.  We will check a cervical spine film today and also for refer her for formal physical therapy.  She also has had off-and-on back pain for years, and I think is reasonable to also have the physical therapist work with her while they are working on her neck.  She also has some severe left-sided lateral epicondylitis.  I think that the oral prednisone that we are giving her for her neck will also likely help this, but I am also going to do a lateral epicondylar injection today.  Tendon Origin / Insertion Injection Procedure Note Kathy Garcia Jan 06, 1966 Date of procedure: 04/23/2022  Procedure: Tendon Origin / Insertion Injection for Lateral Epicondylitis, L Indications: Pain  Procedure Details Verbal consent was obtained from the patient. Risks, benefits, and alternatives were discussed. Potential complications including loss of pigment, atrophy were discussed. Prepped with Chloraprep and Ethyl Chloride used for anesthesia. Under sterile conditions, the patient was injected at the point of maximal tenderness at the ECRB tendon with 1/2 cc of Lidocaine 1% and 1/2 cc of Kenalog 40 mg. Decreased pain after injection.  No complications.  Needle size: 22 gauge 1 1/2 inch Medication: 1/2 cc of Kenalog 40 mg (equaling Kenalog 20 mg)   Medication Management during today's office visit: Meds ordered this encounter  Medications   predniSONE (DELTASONE) 20 MG tablet    Sig: 2 tabs po for 7 days, then 1 tab po for 7 days    Dispense:  21 tablet    Refill:  0   diclofenac Sodium (VOLTAREN) 1 % GEL    Sig: Apply 4 g topically 4 (four) times daily.    Dispense:  500 g    Refill:  11   triamcinolone acetonide (KENALOG-40) injection 20 mg   There are no discontinued medications.  Orders placed today for conditions managed today: Orders Placed This Encounter  Procedures   DG Cervical Spine Complete   DG Lumbar Spine  Complete   Ambulatory referral to Physical Therapy    Disposition: Return in about 6 weeks (around 06/04/2022).  Dragon [employer] One speech-to-text software was used for transcription in this dictation.  Possible transcriptional errors can occur using Animal nutritionist.   Signed,  Elpidio Galea. Valen Gillison, MD   Outpatient Encounter Medications as of 04/23/2022  Medication Sig   acetaminophen (TYLENOL) 500 MG tablet Take 500 mg by mouth every 6 (six) hours as needed.   albuterol (VENTOLIN HFA) 108 (90 Base) MCG/ACT inhaler Inhale 2 puffs into the lungs every 4 (four) hours as needed for wheezing.   Cholecalciferol (VITAMIN D-3 PO) Take by mouth daily.   cholestyramine light (PREVALITE) 4 g packet Take 1 packet (4 g total) by mouth daily.   Cyanocobalamin (B-12 PO) Take by mouth daily.   diclofenac Sodium (VOLTAREN) 1 % GEL Apply 4 g topically 4 (four) times daily.   Multiple Vitamin (MULTIVITAMIN) tablet Take 1 tablet by mouth daily.   omeprazole (PRILOSEC OTC) 20 MG tablet Take 1 tablet (20 mg total) by mouth in the morning and at bedtime.   ondansetron (ZOFRAN-ODT) 4 MG disintegrating tablet TAKE ONE TABLET EVERY EIGHT HOURS AS NEEDED FOR NAUSEA AND VOMITING   predniSONE (DELTASONE) 20 MG tablet  2 tabs po for 7 days, then 1 tab po for 7 days   [EXPIRED] triamcinolone acetonide (KENALOG-40) injection 20 mg    No facility-administered encounter medications on file as of 04/23/2022.

## 2022-05-13 ENCOUNTER — Telehealth: Payer: Self-pay | Admitting: Family Medicine

## 2022-05-13 MED ORDER — BUPROPION HCL ER (XL) 150 MG PO TB24: 150.0000 mg | ORAL_TABLET | Freq: Every day | ORAL | 0 refills | Status: DC

## 2022-05-13 NOTE — Telephone Encounter (Signed)
Caller Name: earnestine shipp Call back phone #: [phone]  MEDICATION(S):  bupropion  Days of Med Remaining:   Has the patient contacted their pharmacy (YES/NO)? NO What did pharmacy advise?   Preferred Pharmacy:  [facility]  ~~~Please advise patient/caregiver to allow 2-3 business days to process RX refills.

## 2022-05-13 NOTE — Telephone Encounter (Signed)
Spoke to pt and she states that she only takes one tablet of the 150 mg bupropion daily. She has been out of it for 2 weeks.  Pt is scheduled for TOC with Tabitha on 06/18/22.

## 2022-05-13 NOTE — Telephone Encounter (Signed)
90 refill provided.

## 2022-05-15 ENCOUNTER — Other Ambulatory Visit: Payer: Self-pay | Admitting: Gastroenterology

## 2022-05-20 ENCOUNTER — Ambulatory Visit (INDEPENDENT_AMBULATORY_CARE_PROVIDER_SITE_OTHER): Payer: No Typology Code available for payment source | Admitting: Family Medicine

## 2022-05-20 ENCOUNTER — Encounter: Payer: Self-pay | Admitting: Family Medicine

## 2022-05-20 VITALS — BP 120/74 | HR 80 | Temp 98.8°F | Ht 63.5 in | Wt 141.5 lb

## 2022-05-20 DIAGNOSIS — M545 Low back pain, unspecified: Secondary | ICD-10-CM | POA: Diagnosis not present

## 2022-05-20 DIAGNOSIS — M7712 Lateral epicondylitis, left elbow: Secondary | ICD-10-CM

## 2022-05-20 DIAGNOSIS — M4802 Spinal stenosis, cervical region: Secondary | ICD-10-CM

## 2022-05-20 DIAGNOSIS — M5412 Radiculopathy, cervical region: Secondary | ICD-10-CM | POA: Diagnosis not present

## 2022-05-20 MED ORDER — PREDNISONE 20 MG PO TABS: ORAL_TABLET | ORAL | 0 refills | Status: DC

## 2022-05-20 NOTE — Progress Notes (Addendum)
Rayford Williamsen T. Vail Vuncannon, MD, CAQ [facility] [address] [place], [address]  Phone: [phone]  FAX: [phone]  Kathy Garcia - 56 y.o. female  MRN [id number]  Date of Birth: 09/05/1965  Date: 05/20/2022  PCP: Mort Sawyers, FNP  Referral: Gweneth Dimitri, MD  Chief Complaint  Patient presents with   Follow-up    Cervical Radiculopathy    Subjective:   Kathy Garcia is a 56 y.o. very pleasant female patient with Body mass index is 24.67 kg/m. who presents with the following:  I remember this patient well.  Last time I saw her, she had presented with neck pain, radicular pain, as well as some severe tennis elbow on the left side.  I did do a tennis elbow injection, give her a long course of some oral prednisone and send her for formal physical therapy.  She is here today in follow-up.  I intervally saw her when she was here with her husband several weeks ago, and she had been doing somewhat better at that point.  Since Monday or Sunday night, has been hurting a lot on the posterior of the shoulder.   Hurting to turn at the neck.  In Feb, was unloading her washing machine - at work.  Hurts when she is sitting and laying.  Some numbness, tingling in her hands.  She is having pain in the posterior of her neck as well as pain in the shoulder blade region, more on the left compared to the right.  She is having muscle tightness and tenderness on the paracervical musculature as well as in the trapezius.  She is having radiating pain down both arms as well as some numbness in the bilateral hands.  She does not have any wrist pain, finger, or hand pain.  At this point her left elbow feels much better.  04/23/2022 Last OV with Hannah Beat, MD  Patient is a new consultation for Dr. Selena Batten, seen by myself for cervical radiculopathy.  She was seen 6 weeks ago, started on formal physical therapy, she was given a long taper of  some prednisone -but she decided to hold off on doing any physical therapy and hold off on doing any prednisone until she came to talk to me..   She is here today in follow-up.   Hands have been numb for a very long time, and they will throb and hurt all of the time.   Will have to walk around and hang down.  Something is going on with her neck.  Woke up in pain and hurt to turn her head on both sides.  -She has neck pain with movement essentially of all types and in all directions with movement of her head and neck she does have terminal pain.  She is able to induce some radicular symptoms down the left arm with neck motion by her self.   She does not have any pain in and about the wrist joints.   L elbow is also hurting.  Has a stabbing constant pain.  Had distant tennis elbow.  Pain localizes to the lateral epicondyle on the left.  She is having trouble fully extending her arm, making a grip, she is sometimes dropping things at work.   In February, was unloading a washing machine, and she has been having pain going down her neck on the L side.    Self-spurling's on the L side.    She also has  been having intermittent back pain off and on for months to years.  She does relate a story of her prior trauma greater than 5 years ago where she had a major fall and accident, and since then she has been having some intermittent back pain.   Traps Neck Rad on the l, cervical    Inject l elbow   34 mins  Review of Systems is noted in the HPI, as appropriate  Patient Active Problem List   Diagnosis Date Noted   Barrett's esophagus 04/02/2022   Generalized abdominal pain    Constipation 03/12/2022   Cervical neuropathy 03/12/2022   Dysplasia of cervix, high grade CIN 2 11/14/2021   Abnormal ultrasound of uterus 11/14/2021   Nausea and vomiting 09/22/2021   Skin lesions 03/03/2021   Chronic neck pain 01/15/2021   Tobacco abuse 01/15/2021   Weight loss 01/15/2021   Generalized anxiety  disorder 06/13/2020   COPD (chronic obstructive pulmonary disease) (HCC) 06/13/2020   Essential hypertension 06/13/2020   Migraines 06/13/2020   Major depressive disorder 06/13/2020   GERD (gastroesophageal reflux disease) 01/03/2014   Chronic diarrhea 01/03/2014    Past Medical History:  Diagnosis Date   Anemia    Anxiety    Arthritis    Asthma    Cervical dysplasia    Chronic abdominal pain    Chronic diarrhea    takes prevalite   Depression    Diverticulosis    GERD (gastroesophageal reflux disease)    Seasonal allergies    Wears glasses     Past Surgical History:  Procedure Laterality Date   CERVICAL CONIZATION W/BX N/A 12/16/2021   Procedure: LEEP  CONIZATION CERVIX WITH BIOPSY;  Surgeon: Reva Bores, MD;  Location: [facility];  Service: Gynecology;  Laterality: N/A;   CERVIX LESION DESTRUCTION  1995   CHOLECYSTECTOMY, LAPAROSCOPIC  01/04/2013   @[facility]    COLONOSCOPY WITH PROPOFOL N/A 03/27/2021   Procedure: COLONOSCOPY WITH PROPOFOL;  Surgeon: 03/29/2021, MD;  Location[facility];  Service: Gastroenterology;  Laterality: N/A;   ESOPHAGOGASTRODUODENOSCOPY (EGD) WITH PROPOFOL N/A 04/01/2022   Procedure: ESOPHAGOGASTRODUODENOSCOPY (EGD) WITH PROPOFOL;  Surgeon: 04/03/2022, MD;  Location: [facility] ENDOSCOPY;  Service: Gastroenterology;  Laterality: N/A;   PR ENDOMET BIOPSY DONE W/COLPOSCOPY  11/12/2021   TONSILLECTOMY  1972   TUBAL LIGATION Bilateral 1996    Family History  Problem Relation Age of Onset   Hypertension Mother    Alzheimer's disease Mother    Diverticulitis Mother    Cancer Father        unknown type   Colon cancer Maternal Aunt    Heart failure Maternal Uncle    Stomach cancer Paternal Aunt    Breast cancer Maternal Aunt    Brain cancer Paternal Aunt    Breast cancer Paternal Aunt     Social History   Social History Narrative   06/13/20   From: the area   Living: with husband 13/11/21 (2019), with husband's cousin 02-08-1999    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 PULM: Breathing comfortably in no respiratory distress PSYCH: Normally interactive.   Cervical spine: Roughly 65% loss of motion in all directions. Pain with terminal motion in all directions. Pain in the posterior paracervical spine as well as in the bilateral trapezius region, left greater than right. She is able to induce pain down the arm with rotational movement, self-Spurling's is positive.  Full range of motion of the shoulder bilaterally. Strength is 5/5 at the shoulder and the remainder of the upper extremities Jobe, Neer, and Leanord Asal testing is all negative.  Sensation is intact throughout. Motor function and strength is intact throughout the bilateral upper extremities  Laboratory and Imaging Data: DG Cervical Spine Complete  Result Date: 04/24/2022 CLINICAL DATA:  Chronic neck and back pain.  No recent injury. EXAM: CERVICAL SPINE - COMPLETE 4+ VIEW; LUMBAR SPINE - COMPLETE 4+ VIEW COMPARISON:  CT of the abdomen and pelvis and reconstructions 01/28/2021, cervical spine series 02/01/2019 FINDINGS: Cervical spine AP, lateral, odontoid and both obliques: There is no precervical soft tissue thickening. There is mild osteopenia. Evidence of fractures is not seen. There is preservation of the normal vertebral body heights. Alignment is unchanged with a mild grade 1 C5-6 retrolisthesis and a reversed cervical  lordosis without further listhesis. Advanced degenerative disc collapse with bidirectional osteophytes is again noted at C5-6 and C6-7, slight disc space loss with bidirectional osteophytes also noted at C4-5, with normal disc heights at C2-3, C3-4 and C7-T1. Multilevel uncinate and facet joint hypertrophy is seen left-greater-than-right. Degenerative foraminal stenosis is bilaterally moderate to severe C3-4, bilaterally moderate C4-5, moderate to severe on the right and moderate on the left at C5-6 and C6-7, and bilaterally mild at C7-T1. Visualized lung apices are clear. There are calcifications in the left carotid bulb symmetrically. Lumbar spine AP Lat, both obliques and lumbosacral spot lateral: There is mild-to-moderate lumbar dextrorotary scoliosis, apex L1-2. No AP listhesis is seen. There is osteopenia. No fracture is evident with preservation of the normal vertebral body heights all levels. There is mild disc space loss at L2-3, L3-4 and L4-5, normal disc heights at L1-2 and L5-S1, and mild spondylosis. There is mild facet joint hypertrophy from L3-4 down. The foramina are not well seen due to positioning. Both SI joints are patent. Cholecystectomy clips are again shown. No visible nephrolithiasis. There is patchy calcification of the abdominal aorta. IMPRESSION: 1. Cervical spine osteopenia degenerative changes without fractures. 2. Chronic reversed cervical lordosis with chronic grade 1 C5-6 retrolisthesis. 3. Scoliosis and degenerative changes of the lumbar spine without evidence of fractures. 4. Comparison to the prior studies reveals no significant interval change. 5. Aortic and carotid atherosclerosis. Electronically Signed   By: Almira Bar M.D.   On: 04/24/2022 23:01   DG Lumbar Spine Complete  Result Date: 04/24/2022 CLINICAL DATA:  Chronic neck and back pain.  No recent injury. EXAM: CERVICAL SPINE - COMPLETE 4+ VIEW; LUMBAR SPINE - COMPLETE 4+ VIEW COMPARISON:  CT of the abdomen and  pelvis and reconstructions 01/28/2021, cervical spine series 02/01/2019 FINDINGS: Cervical spine AP, lateral, odontoid and both obliques: There is no precervical soft tissue thickening. There is mild osteopenia. Evidence of fractures is not seen.  There is preservation of the normal vertebral body heights. Alignment is unchanged with a mild grade 1 C5-6 retrolisthesis and a reversed cervical lordosis without further listhesis. Advanced degenerative disc collapse with bidirectional osteophytes is again noted at C5-6 and C6-7, slight disc space loss with bidirectional osteophytes also noted at C4-5, with normal disc heights at C2-3, C3-4 and C7-T1. Multilevel uncinate and facet joint hypertrophy is seen left-greater-than-right. Degenerative foraminal stenosis is bilaterally moderate to severe C3-4, bilaterally moderate C4-5, moderate to severe on the right and moderate on the left at C5-6 and C6-7, and bilaterally mild at C7-T1. Visualized lung apices are clear. There are calcifications in the left carotid bulb symmetrically. Lumbar spine AP Lat, both obliques and lumbosacral spot lateral: There is mild-to-moderate lumbar dextrorotary scoliosis, apex L1-2. No AP listhesis is seen. There is osteopenia. No fracture is evident with preservation of the normal vertebral body heights all levels. There is mild disc space loss at L2-3, L3-4 and L4-5, normal disc heights at L1-2 and L5-S1, and mild spondylosis. There is mild facet joint hypertrophy from L3-4 down. The foramina are not well seen due to positioning. Both SI joints are patent. Cholecystectomy clips are again shown. No visible nephrolithiasis. There is patchy calcification of the abdominal aorta. IMPRESSION: 1. Cervical spine osteopenia degenerative changes without fractures. 2. Chronic reversed cervical lordosis with chronic grade 1 C5-6 retrolisthesis. 3. Scoliosis and degenerative changes of the lumbar spine without evidence of fractures. 4. Comparison to the  prior studies reveals no significant interval change. 5. Aortic and carotid atherosclerosis. Electronically Signed   By: Almira Bar M.D.   On: 04/24/2022 23:01     MR CERVICAL SPINE W WO CONTRAST  Result Date: 06/06/2022 CLINICAL DATA:  Left arm pain and numbness. EXAM: MRI CERVICAL SPINE WITHOUT AND WITH CONTRAST TECHNIQUE: Multiplanar and multiecho pulse sequences of the cervical spine, to include the craniocervical junction and cervicothoracic junction, were obtained without and with intravenous contrast. CONTRAST:  6mL GADAVIST GADOBUTROL 1 MMOL/ML IV SOLN COMPARISON:  None Available. FINDINGS: Alignment: Minimal retrolisthesis of C5 on C6 and C6 on C7. Vertebrae: No acute fracture, evidence of discitis, or aggressive bone lesion. Cord: Normal signal and morphology. Posterior Fossa, vertebral arteries, paraspinal tissues: Posterior fossa demonstrates no focal abnormality. Vertebral artery flow voids are maintained. Paraspinal soft tissues are unremarkable. Disc levels: Discs: Degenerative disease with disc height loss at C5-6, C6-7 and to lesser extent C4-5. Degenerative disease with disc height loss at T2-3. C2-3: No significant disc bulge. No neural foraminal stenosis. No central canal stenosis. C3-4: No disc protrusion. Severe left facet arthropathy. Moderate left foraminal stenosis. No right foraminal stenosis. No spinal stenosis. C4-5: Mild broad-based disc bulge. Severe left facet arthropathy. Left uncovertebral degenerative changes. Severe left foraminal stenosis. No right foraminal stenosis. No spinal stenosis. C5-6: Broad-based disc osteophyte complex. Bilateral uncovertebral degenerative changes. Severe bilateral foraminal stenosis. Mild spinal stenosis. C6-7: Broad-based disc bulge. Bilateral uncovertebral degenerative changes. Severe bilateral foraminal stenosis, right worse than left. C7-T1: No significant disc bulge. No neural foraminal stenosis. No central canal stenosis. Small left  perineural cyst. T2-3: Mild broad-based disc bulge. No foraminal or central canal stenosis. IMPRESSION: 1. At C3-4 there is severe left facet arthropathy. Moderate left foraminal stenosis. 2. At C4-5 there is a mild broad-based disc bulge. Severe left facet arthropathy. Left uncovertebral degenerative changes. Severe left foraminal stenosis. 3. At C5-6 there is a broad-based disc osteophyte complex. Bilateral uncovertebral degenerative changes. Severe bilateral foraminal stenosis. Mild spinal stenosis. 4. At  C6-7 there is a broad-based disc bulge. Bilateral uncovertebral degenerative changes. Severe bilateral foraminal stenosis, right worse than left. 5. No acute osseous injury of the cervical spine. Electronically Signed   By: Kathreen Devoid M.D.   On: 06/06/2022 10:00     Assessment and Plan:     ICD-10-CM   1. Cervical radiculopathy, acute  M54.12 MR CERVICAL SPINE W WO CONTRAST    acetaminophen-codeine (TYLENOL #3) 300-30 MG tablet    Ambulatory referral to Neurosurgery    2. Lateral epicondylitis of left elbow  M77.12     3. Low back pain of over 3 months duration  M54.50     4. Foraminal stenosis of cervical region  M48.02 Ambulatory referral to Neurosurgery     Acute on chronic neck pain with bilateral cervical radiculopathy ongoing for this current acute time.  For 3 months.  Failure of conservative measures thus far including NSAIDs, neuropathic pain medication, as well as oral steroids with recurrent bilateral upper extremity symptoms and radiculopathy.  She has been referred to physical therapy.  We will repeat her burst of steroids today.  Obtain an MRI of the cervical spine without contrast to evaluate for spinal stenosis, foraminal stenosis, or acute disc herniation.  Addendum: 06/08/22 10:27 AM  C3-C7 severe foraminal stenosis with ongoing severe, 8/10 pain at all times in the neck with additional shoulder blade pain and radiculopathy on the L.  Complicated MRI of the neck.  She  would like to see Dr. Arnoldo Morale from [facility], and my recommendation is to speak to Neurosurgery.  She is not doing well from a pain standpoint, and I will send her in some Tylenol #3 until she can see Neurosurgery.   Medication Management during today's office visit: Meds ordered this encounter  Medications   predniSONE (DELTASONE) 20 MG tablet    Sig: 2 tabs po for 7 days, then 1 tab po for 7 days    Dispense:  21 tablet    Refill:  0   acetaminophen-codeine (TYLENOL #3) 300-30 MG tablet    Sig: Take 1 tablet by mouth every 6 (six) hours as needed for moderate pain or severe pain.    Dispense:  20 tablet    Refill:  0   Medications Discontinued During This Encounter  Medication Reason   predniSONE (DELTASONE) 20 MG tablet Completed Course    Orders placed today for conditions managed today: Orders Placed This Encounter  Procedures   MR [place]   Ambulatory referral to Neurosurgery    Disposition: No follow-ups on file.  Dragon [employer] One speech-to-text software was used for transcription in this dictation.  Possible transcriptional errors can occur using Editor, commissioning.   Signed,  Maud Deed. Giuseppina Quinones, MD   Outpatient Encounter Medications as of 05/20/2022  Medication Sig   acetaminophen (TYLENOL) 500 MG tablet Take 500 mg by mouth every 6 (six) hours as needed.   acetaminophen-codeine (TYLENOL #3) 300-30 MG tablet Take 1 tablet by mouth every 6 (six) hours as needed for moderate pain or severe pain.   albuterol (VENTOLIN HFA) 108 (90 Base) MCG/ACT inhaler Inhale 2 puffs into the lungs every 4 (four) hours as needed for wheezing.   buPROPion (WELLBUTRIN XL) 150 MG 24 hr tablet Take 1 tablet (150 mg total) by mouth daily.   Cholecalciferol (VITAMIN D-3 PO) Take by mouth daily.   cholestyramine light (PREVALITE) 4 g packet Take 1 packet (4 g total) by mouth daily.  Cyanocobalamin (B-12 PO) Take by mouth daily.   diclofenac Sodium  (VOLTAREN) 1 % GEL Apply 4 g topically 4 (four) times daily.   Multiple Vitamin (MULTIVITAMIN) tablet Take 1 tablet by mouth daily.   omeprazole (PRILOSEC) 20 MG capsule Take 1 capsule (20 mg total) by mouth 2 (two) times daily before a meal.   ondansetron (ZOFRAN-ODT) 4 MG disintegrating tablet TAKE ONE TABLET EVERY EIGHT HOURS AS NEEDED FOR NAUSEA AND VOMITING   predniSONE (DELTASONE) 20 MG tablet 2 tabs po for 7 days, then 1 tab po for 7 days   [DISCONTINUED] predniSONE (DELTASONE) 20 MG tablet 2 tabs po for 7 days, then 1 tab po for 7 days   No facility-administered encounter medications on file as of 05/20/2022.

## 2022-05-25 ENCOUNTER — Telehealth: Payer: Self-pay | Admitting: Family Medicine

## 2022-05-25 NOTE — Telephone Encounter (Signed)
Pt called in to make PCP aware that she is schedule for her MRI for 06/03/22 at 3:00 pm

## 2022-05-25 NOTE — Telephone Encounter (Signed)
Changed myself to her pcp this way I should be CC on results. MRI was ordered by Dr. Lorelei Pont however so may go to him.

## 2022-06-03 ENCOUNTER — Ambulatory Visit
Admission: RE | Admit: 2022-06-03 | Discharge: 2022-06-03 | Disposition: A | Discharge status: Home / Self Care | Payer: PRIVATE HEALTH INSURANCE | Source: Ambulatory Visit | Attending: Family Medicine | Admitting: Family Medicine

## 2022-06-03 DIAGNOSIS — M5412 Radiculopathy, cervical region: Secondary | ICD-10-CM | POA: Diagnosis present

## 2022-06-03 MED ORDER — GADOBUTROL 1 MMOL/ML IV SOLN
6.0000 mL | Freq: Once | INTRAVENOUS | Status: AC | PRN
  Administered 2022-06-03: 6 mL via INTRAVENOUS

## 2022-06-04 ENCOUNTER — Ambulatory Visit: Payer: No Typology Code available for payment source | Admitting: Family Medicine

## 2022-06-08 ENCOUNTER — Encounter: Payer: Self-pay | Admitting: *Deleted

## 2022-06-08 ENCOUNTER — Other Ambulatory Visit: Payer: Self-pay

## 2022-06-08 ENCOUNTER — Other Ambulatory Visit: Payer: Self-pay | Admitting: Family

## 2022-06-08 ENCOUNTER — Emergency Department
Admission: EM | Admit: 2022-06-08 | Discharge: 2022-06-08 | Disposition: A | Discharge status: Home / Self Care | Payer: PRIVATE HEALTH INSURANCE | Attending: Emergency Medicine | Admitting: Emergency Medicine

## 2022-06-08 ENCOUNTER — Telehealth: Payer: Self-pay | Admitting: Family

## 2022-06-08 ENCOUNTER — Emergency Department: Payer: PRIVATE HEALTH INSURANCE

## 2022-06-08 DIAGNOSIS — R091 Pleurisy: Secondary | ICD-10-CM | POA: Diagnosis not present

## 2022-06-08 DIAGNOSIS — U071 COVID-19: Secondary | ICD-10-CM | POA: Insufficient documentation

## 2022-06-08 DIAGNOSIS — R0602 Shortness of breath: Secondary | ICD-10-CM | POA: Diagnosis present

## 2022-06-08 LAB — COMPREHENSIVE METABOLIC PANEL
ALT: 21 U/L (ref 0–44)
AST: 19 U/L (ref 15–41)
Albumin: 4.4 g/dL (ref 3.5–5.0)
Alkaline Phosphatase: 66 U/L (ref 38–126)
Anion gap: 8 (ref 5–15)
BUN: 23 mg/dL — ABNORMAL HIGH (ref 6–20)
CO2: 29 mmol/L (ref 22–32)
Calcium: 9.3 mg/dL (ref 8.9–10.3)
Chloride: 103 mmol/L (ref 98–111)
Creatinine, Ser: 0.91 mg/dL (ref 0.44–1.00)
GFR, Estimated: >60 mL/min (ref >60)
Glucose, Bld: 87 mg/dL (ref 70–99)
Potassium: 3.8 mmol/L (ref 3.5–5.1)
Sodium: 140 mmol/L (ref 135–145)
Total Bilirubin: 0.6 mg/dL (ref 0.3–1.2)
Total Protein: 7.5 g/dL (ref 6.5–8.1)

## 2022-06-08 LAB — CBC WITH DIFFERENTIAL/PLATELET
Abs Immature Granulocytes: 0.04 10*3/uL (ref 0.00–0.07)
Basophils Absolute: 0 10*3/uL (ref 0.0–0.1)
Basophils Relative: 0 %
Eosinophils Absolute: 0.1 10*3/uL (ref 0.0–0.5)
Eosinophils Relative: 1 %
HCT: 39.8 % (ref 36.0–46.0)
Hemoglobin: 13 g/dL (ref 12.0–15.0)
Immature Granulocytes: 0 %
Lymphocytes Relative: 18 %
Lymphs Abs: 1.6 10*3/uL (ref 0.7–4.0)
MCH: 33.2 pg (ref 26.0–34.0)
MCHC: 32.7 g/dL (ref 30.0–36.0)
MCV: 101.8 fL — ABNORMAL HIGH (ref 80.0–100.0)
Monocytes Absolute: 0.6 10*3/uL (ref 0.1–1.0)
Monocytes Relative: 7 %
Neutro Abs: 6.6 10*3/uL (ref 1.7–7.7)
Neutrophils Relative %: 74 %
Platelets: 182 10*3/uL (ref 150–400)
RBC: 3.91 MIL/uL (ref 3.87–5.11)
RDW: 12.7 % (ref 11.5–15.5)
WBC: 9 10*3/uL (ref 4.0–10.5)
nRBC: 0 % (ref 0.0–0.2)

## 2022-06-08 LAB — D-DIMER, QUANTITATIVE: D-Dimer, Quant: <0.27 ug/mL-FEU (ref 0.00–0.50)

## 2022-06-08 LAB — TROPONIN I (HIGH SENSITIVITY)
Troponin I (High Sensitivity): 5 ng/L (ref <18)
Troponin I (High Sensitivity): 5 ng/L (ref <18)

## 2022-06-08 MED ORDER — ALBUTEROL SULFATE HFA 108 (90 BASE) MCG/ACT IN AERS: 2.0000 | INHALATION_SPRAY | RESPIRATORY_TRACT | Status: DC | PRN

## 2022-06-08 MED ORDER — KETOROLAC TROMETHAMINE 30 MG/ML IJ SOLN
30.0000 mg | Freq: Once | INTRAMUSCULAR | Status: AC
  Administered 2022-06-08: 30 mg via INTRAMUSCULAR
  Filled 2022-06-08: qty 1

## 2022-06-08 MED ORDER — LIDOCAINE 5 % EX PTCH
1.0000 | MEDICATED_PATCH | CUTANEOUS | Status: DC
  Administered 2022-06-08: 1 via TRANSDERMAL
  Filled 2022-06-08: qty 1

## 2022-06-08 MED ORDER — ALBUTEROL SULFATE HFA 108 (90 BASE) MCG/ACT IN AERS: 2.0000 | INHALATION_SPRAY | Freq: Four times a day (QID) | RESPIRATORY_TRACT | 2 refills | Status: DC | PRN

## 2022-06-08 MED ORDER — ACETAMINOPHEN-CODEINE 300-30 MG PO TABS: 1.0000 | ORAL_TABLET | Freq: Four times a day (QID) | ORAL | 0 refills | Status: DC | PRN

## 2022-06-08 MED ORDER — IBUPROFEN 600 MG PO TABS: 600.0000 mg | ORAL_TABLET | Freq: Three times a day (TID) | ORAL | 0 refills | Status: AC | PRN

## 2022-06-08 MED ORDER — PREDNISONE 20 MG PO TABS: 40.0000 mg | ORAL_TABLET | Freq: Every day | ORAL | 0 refills | Status: AC

## 2022-06-08 MED ORDER — AZITHROMYCIN 250 MG PO TABS: ORAL_TABLET | ORAL | 0 refills | Status: AC

## 2022-06-08 NOTE — Addendum Note (Signed)
Addended by: Owens Loffler on: 06/08/2022 10:27 AM   Modules accepted: Orders

## 2022-06-08 NOTE — Telephone Encounter (Signed)
Noted  

## 2022-06-08 NOTE — Telephone Encounter (Signed)
Melanie RN with Access nurse called and pt has SOB and rt side CP that is worse with exercise and deep breath. Disposition was pt needed to be seen at ED and pt said to [facility] if meds do not work she will go to ED. Pt is presently at work.  I called pt and pt works at [employer] place and pt is walking around facility with laundry cart and pt is out of breath and cannot take a deep breath and pt having continuous rt sided CP.20 mins ago pt had rt arm pain.  Access disposition was ED; pt is presently at work and will ck with supervisor if she can leave work now. Pt does not have cough or wheezing. Pt just thought she could get albuterol inhaler; pt has not gotten albuterol inhaler since 2021. Advised pt if having CP, rt arm pain and SOB should not drive and call [phone]; pt said rt arm not hurting now. Pt said if her supervisor will not let her leave work then she will go to [facility] ED at 8 PM when gets off work. I advised pt should go be evaluated ASAP and pt voiced understanding. Pt has TOC with T Dugal FNP on 06/18/22 and I spoke with Claiborne Billings CMA and sending note to T Dugal FNP and Claiborne Billings CMA.      [place] [facility] - Client TELEPHONE ADVICE RECORD AccessNurse Patient Name: Kathy Garcia Gender: Female DOB: August 01, 1966 Age: 56 Y 65 M 25 D Return Phone Number: [phone] (Primary) Address: City/ State/ Zip: [place] [address] Client [facility] - Client Client Site [place] - Day Provider Waunita Schooner- MD Contact Type Call Who Is Calling Patient / Member / Family / Caregiver Call Type Triage / Clinical Relationship To Patient Self Return Phone Number [phone] (Primary) Chief Complaint BREATHING - shortness of breath or sounds breathless Reason for Call Symptomatic / Request for Health Information Initial Comment Office transferring caller patient having shortness of breath of pain when taking a deep breath  behind right breast, caller refuses to go to ER and states she just needs her inhaler [place] Not Listed Undecided, will go if Rescue inhaler doesn't work. Translation No Nurse Assessment Nurse: Lenon Curt, RN, Melanie Date/Time (Eastern Time): 06/08/2022 2:51:37 PM Please select the assessment type ---Refill Additional Documentation [facility] [phone] Does the patient have enough medication to last until the office opens? ---Unable to obtain loaner dose from Pharmacy Does the client directives allow for assistance with medications after hours? ---No Nurse: Lenon Curt, RN, Melanie Date/Time (Eastern Time): 06/08/2022 2:46:00 PM Confirm and document reason for call. If symptomatic, describe symptoms. ---Caller states having shortness of breath and pain behind right breast when taking a deep breath or raising arm over head, requesting rescue inhaler. Does the patient have any new or worsening symptoms? ---Yes Will a triage be completed? ---Yes Related visit to physician within the last 2 weeks? ---No Does the PT have any chronic conditions? (i.e. diabetes, asthma, this includes High risk factors for pregnancy, etc.) ---Yes Is this a behavioral health or substance abuse call? ---No PLEASE NOTE: All timestamps contained within this report are represented as [place] Standard Time. CONFIDENTIALTY NOTICE: This fax transmission is intended only for the addressee. It contains information that is legally privileged, confidential or otherwise protected from use or disclosure. If you are not the intended recipient, you are strictly prohibited from reviewing, disclosing, copying using or  disseminating any of this information or taking any action in reliance on or regarding this information. If you have received this fax in error, please notify us immediately by telephone so that we can arrange for its return to [place]. Phone: [phone], Toll-Free: [phone], Fax:  [phone] Page: 2 of 2 Call Id: [id number] Guidelines Guideline Title Affirmed Question Affirmed Notes Nurse Date/Time Lamount Cohen Time) Chest Pain Difficulty breathing Kennieth Francois 06/08/2022 2:49:56 PM Disp. Time Lamount Cohen Time) Disposition Final User 06/08/2022 2:43:39 PM Send to Urgent Michela Pitcher, Lanette 06/08/2022 2:56:54 PM Go to ED Now Yes Izora Ribas, RN, Melanie Final Disposition 06/08/2022 2:56:54 PM Go to ED Now Yes Izora Ribas, RN, Mittie Bodo Disagree/Comply Disagree Caller Understands Yes PreDisposition Call Doctor Care Advice Given Per Guideline GO TO ED NOW: * You need to be seen in the Emergency Department. * Go to the ED at ___________ Hospital. * Leave now. Drive carefully. * Another adult should drive. * Patient should not delay going to the emergency department. BRING MEDICINES: CARE ADVICE given per Chest Pain (Adult) guideline. Comments User: Patria Mane, RN Date/Time Lamount Cohen Time): 06/08/2022 2:48:24 PM Has an appt on 11/16 with Tabitha Dugal User: Patria Mane, RN Date/Time Lamount Cohen Time): 06/08/2022 2:54:48 PM Please call the patient back to let her know if able to call in inhaler or not. Willing to go ER/UC if inhaler doesn't work. User: Patria Mane, RN Date/Time Lamount Cohen Time): 06/08/2022 2:59:29 PM Currently at work. User: Patria Mane, RN Date/Time Lamount Cohen Time): 06/08/2022 3:01:11 PM Called office and made aware of outcome, refusal. Referrals GO TO FACILITY OTHER - [facility]

## 2022-06-08 NOTE — Telephone Encounter (Signed)
Patient called in and stated that she needs a new prescription for her emergency inhaler and [facility] Thank you!  PHARMACY:  [id number] [place], [place] [facility] [address]

## 2022-06-08 NOTE — Telephone Encounter (Signed)
Patient called in and stated she is experiencing some SOB and pain behind her right breast when she breathe and lift her right arm. Sent over to access nurse.

## 2022-06-08 NOTE — ED Provider Triage Note (Signed)
Emergency Medicine Provider Triage Evaluation Note  Kathy Garcia , a 56 y.o. female  was evaluated in triage.  Pt complains of right side chest pain with deep breath and certain movement. No cardiac history. No history of DVT or PE.  Physical Exam  BP 128/80 (BP Location: Left Arm)   Pulse 78   Temp 99 F (37.2 C)   Resp 16   LMP 12/01/2012 (Exact Date)   SpO2 98%  Gen:   Awake, no distress   Resp:  Normal effort  MSK:   Moves extremities without difficulty  Other:   Medical Decision Making  Medically screening exam initiated at 5:44 PM.  Appropriate orders placed.  TINZLEE CRAKER was informed that the remainder of the evaluation will be completed by another provider, this initial triage assessment does not replace that evaluation, and the importance of remaining in the ED until their evaluation is complete.  Unable to [facility] due to age over 63. D dimer added to protocols.   Victorino Dike, FNP 06/08/22 1911

## 2022-06-08 NOTE — Discharge Instructions (Addendum)
Your COVID test is still pending.  leave your phone on you we will call you if it is positive.  In the meantime we have started you on some steroids and antibiotics in case this could be related to your lungs as well as some ibuprofen to take with food in case this is related to a muscle as well as an inhaler.  Try to cut down your smoking and return to the ER if develop worsening shortness of breath rash or any other concerns

## 2022-06-08 NOTE — ED Provider Notes (Addendum)
[facility] Provider Note    Event Date/Time   First MD Initiated Contact with Patient 06/08/22 1947     (approximate)   History   Shortness of Breath   HPI  Kathy Garcia is a 56 y.o. female with history of cervical radiculopathy status post recent MRI who comes in with concerns for chest pain and shortness of breath that started around noon.  She denies any cough, fevers.  Patient does that she is a daily smoker.  She denies any history of COPD but does report having an inhaler prescribed to her previously but not currently having 1.  She reports being in between primary doctors at this time.  She reports that she has sudden onset of some shortness of breath and pain with breathing.  She reports that she does not have any pain when she stays completely still and she does not take any breaths.  She denies any history of blood clots, swelling her legs, coughing up blood.  She does report that she did have some pain when she was moving her right arm but that seems to be better now.  She reports the pain is mostly behind her right breast.   Physical Exam   Triage Vital Signs: ED Triage Vitals  Enc Vitals Group     BP 06/08/22 1737 128/80     Pulse Rate 06/08/22 1737 78     Resp 06/08/22 1737 16     Temp 06/08/22 1737 99 F (37.2 C)     Temp src --      SpO2 06/08/22 1737 98 %     Weight 06/08/22 1744 143 lb (64.9 kg)     Height 06/08/22 1744 5\' 3"  (1.6 m)     Head Circumference --      Peak Flow --      Pain Score 06/08/22 1744 8     Pain Loc --      Pain Edu? --      Excl. in [place]? --     Most recent vital signs: Vitals:   06/08/22 1737  BP: 128/80  Pulse: 78  Resp: 16  Temp: 99 F (37.2 C)  SpO2: 98%     General: Awake, no distress.  CV:  Good peripheral perfusion.  Resp:  Normal effort.  Abd:  No distention.  Other:  Patient has no tenderness with pressing on the area although she does report pain every time she takes a deep breath.   She has no rash noted.  She is got equal radial pulses equal pedal pulses.  No swelling the legs.  No calf tenderness.  She got no abdominal tenderness.  No obvious wheezing noted She has equal strength in her arms and no numbness or tingling.  ED Results / Procedures / Treatments   Labs (all labs ordered are listed, but only abnormal results are displayed) Labs Reviewed  CBC WITH DIFFERENTIAL/PLATELET - Abnormal; Notable for the following components:      Result Value   MCV 101.8 (*)    All other components within normal limits  COMPREHENSIVE METABOLIC PANEL - Abnormal; Notable for the following components:   BUN 23 (*)    All other components within normal limits  RESP PANEL BY RT-PCR (FLU A&B, COVID) ARPGX2  D-DIMER, QUANTITATIVE  TROPONIN I (HIGH SENSITIVITY)  TROPONIN I (HIGH SENSITIVITY)     EKG  My interpretation of EKG:  Normal sinus rate of 75 without any ST elevation or T  wave inversions, normal intervals  RADIOLOGY I have reviewed the xray personally and interpreted no evidence of any pneumonia PROCEDURES:  Critical Care performed: No  Procedures   MEDICATIONS ORDERED IN ED: Medications  albuterol (VENTOLIN HFA) 108 (90 Base) MCG/ACT inhaler 2 puff (has no administration in time range)     IMPRESSION / MDM / ASSESSMENT AND PLAN / ED COURSE  I reviewed the triage vital signs and the nursing notes.   Patient's presentation is most consistent with acute presentation with potential threat to life or bodily function.   Differential is ACS, PE patient had low as well scores therefore D-dimer was ordered, musculoskeletal, pleurisy, bronchitis, COPD.  Patient has no pain unless she is taking a deep breath or she is moving therefore I have lower suspicion for dissection.  Her chest x-ray was reassuring and her pulses are equal throughout.  We will give a dose of Toradol, lidocaine patch  Troponin was negative.  D-dimer is negative.  CBC shows normal hemoglobin.   CMP slightly elevated BUN but similar to prior  Repeat temperature was normal at 98.  We will get a repeat troponin and COVID testing if negative will discharge patient home with treatment for possible pleurisy, bronchitis with some steroids, azithromycin, ibuprofen.  We discussed smoking cessation which she understood.  Considered admission but given oxygen levels are normal and vitals are reassuring patient can be treated outpatient. Reevaluated patient repeat troponin is negative.  She reports no pain still unless she moves or takes a deep breath.  When she breathes shallowly she has no pain.  Discussed the above plan and she felt comfortable with this.    FINAL CLINICAL IMPRESSION(S) / ED DIAGNOSES   Final diagnoses:  Pleurisy     Rx / DC Orders   ED Discharge Orders          Ordered    albuterol (VENTOLIN HFA) 108 (90 Base) MCG/ACT inhaler  Every 6 hours PRN        06/08/22 2034    predniSONE (DELTASONE) 20 MG tablet  Daily with breakfast        06/08/22 2034    azithromycin (ZITHROMAX Z-PAK) 250 MG tablet        06/08/22 2034    ibuprofen (ADVIL) 600 MG tablet  Every 8 hours PRN        06/08/22 2034             Note:  This document was prepared using Dragon voice recognition software and may include unintentional dictation errors.   Concha Se, MD 06/08/22 2134    Concha Se, MD 06/08/22 2135

## 2022-06-08 NOTE — ED Triage Notes (Addendum)
Here for SOB and pain behind Left breast, onset at work around 1200, mentions heavy lifting at work, felt fine prior to work, denies fever, NVD, cough, mentions occasional intermittent dizziness light headedness. Pain worse with deep breath. Mentions recent MRI for neck issues. Smoker. Denies HRT. Rates pain 8/10. Pain worse with inspiration and movement, can feel in back, no radiation. Slipped and fell 1 week ago. Other recent falls.

## 2022-06-09 ENCOUNTER — Telehealth: Payer: Self-pay | Admitting: Emergency Medicine

## 2022-06-09 LAB — RESP PANEL BY RT-PCR (FLU A&B, COVID) ARPGX2
Influenza A by PCR: NEGATIVE
Influenza B by PCR: NEGATIVE
SARS Coronavirus 2 by RT PCR: POSITIVE — AB

## 2022-06-09 MED ORDER — NIRMATRELVIR/RITONAVIR (PAXLOVID)TABLET: 3.0000 | ORAL_TABLET | Freq: Two times a day (BID) | ORAL | 0 refills | Status: AC

## 2022-06-09 MED ORDER — NIRMATRELVIR/RITONAVIR (PAXLOVID)TABLET: 3.0000 | ORAL_TABLET | Freq: Two times a day (BID) | ORAL | 0 refills | Status: DC

## 2022-06-09 NOTE — Telephone Encounter (Signed)
Please advise pt that urgent care she went to yesterday sent in albuterol 11/6 to pharmacy.

## 2022-06-09 NOTE — Telephone Encounter (Signed)
Agree with precautions given to pt , as she needs to go to ER immediately given these symptoms as you suggested. Agree with nurse assessment in plan.  Thank you for speaking with them.

## 2022-06-09 NOTE — Telephone Encounter (Signed)
Patient was contacted that her COVID test was positive.  Patient understands to quarantine and to let her work know.  Patient is only on Wellbutrin and acid reducer.  Her GFR was normal.  We will start patient on Paxlovid after discussion of pros and cons patient would like to do this.  Recommended she stop taking the steroids.  Patient expressed understanding and felt comfortable with plan\   Double checked with pharmacy and no issues with the paxlovid and Wellbutrin

## 2022-06-11 ENCOUNTER — Ambulatory Visit: Payer: No Typology Code available for payment source | Admitting: Gastroenterology

## 2022-06-18 ENCOUNTER — Encounter: Payer: Self-pay | Admitting: Family

## 2022-06-18 ENCOUNTER — Ambulatory Visit (INDEPENDENT_AMBULATORY_CARE_PROVIDER_SITE_OTHER): Payer: No Typology Code available for payment source | Admitting: Family

## 2022-06-18 VITALS — BP 132/66 | HR 81 | Temp 98.2°F | Resp 16 | Ht 63.0 in | Wt 145.1 lb

## 2022-06-18 DIAGNOSIS — F331 Major depressive disorder, recurrent, moderate: Secondary | ICD-10-CM

## 2022-06-18 DIAGNOSIS — Z720 Tobacco use: Secondary | ICD-10-CM

## 2022-06-18 DIAGNOSIS — K2270 Barrett's esophagus without dysplasia: Secondary | ICD-10-CM

## 2022-06-18 DIAGNOSIS — R935 Abnormal findings on diagnostic imaging of other abdominal regions, including retroperitoneum: Secondary | ICD-10-CM

## 2022-06-18 DIAGNOSIS — M5412 Radiculopathy, cervical region: Secondary | ICD-10-CM

## 2022-06-18 DIAGNOSIS — F411 Generalized anxiety disorder: Secondary | ICD-10-CM | POA: Diagnosis not present

## 2022-06-18 DIAGNOSIS — N871 Moderate cervical dysplasia: Secondary | ICD-10-CM

## 2022-06-18 MED ORDER — SERTRALINE HCL 50 MG PO TABS: 50.0000 mg | ORAL_TABLET | Freq: Every day | ORAL | 3 refills | Status: DC

## 2022-06-18 NOTE — Patient Instructions (Addendum)
Schedule appointment for pap with obgyn, due for repeat 07/02/22.   Follow up with GI as scheduled.   Start sertraline (Zoloft) 50 mg for anxiety and depression. Take 1/2 tablet by mouth once daily for about one week, then increase to 1 full tablet thereafter.   Taking the medicine as directed and not missing any doses is one of the best things you can do to treat your anxiety/depression.  Here are some things to keep in mind:  Side effects (stomach upset, some increased anxiety) may happen before you notice a benefit.  These side effects typically go away over time. Changes to your dose of medicine or a change in medication all together is sometimes necessary Many people will notice an improvement within two weeks but the full effect of the medication can take up to 4-6 weeks Stopping the medication when you start feeling better often results in a return of symptoms. Most people need to be on medication at least 6-12 months If you start having thoughts of hurting yourself or others after starting this medicine, please call me immediately.

## 2022-06-18 NOTE — Assessment & Plan Note (Signed)
Reviewed note with Dr. Patsy Lager as well as recent imaging MRI 11/23. Looked up referral and gave pt phone number to get scheduled.

## 2022-06-18 NOTE — Progress Notes (Signed)
Established Patient Office Visit  Subjective:  Patient ID: Kathy Garcia, female    DOB: 10-Nov-1965  Age: 56 y.o. MRN: [id number]  CC:  Chief Complaint  Patient presents with   Transitions Of Care    HPI REYGAN HEAGLE is here today for a TOC Prior provider: Dr. Waunita Schooner.   Went to [facility] 11/6 , diagnosed with pleurisy. Discharged with zpack and ibuprofen and prednisone. Last week tested positive for covid, test positive was 06/08/22. She is feeling much better, feeling of tightness on chest comes and goes. No sob. No fever or chills. She did just complete antiviral.   Seen by Dr. Lorelei Pont for cervical radiculopathy,  has been referred for physical therapy, and has had a long taper of prednisone in the past. Se is looking to get referred to a local neurosurgeon as one she had wanted to go to was out of network. 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 She was referred to Meade Maw, MD however needs to call them back to get scheduled.   Chronic nausea: ondansetron, only takes as needed. Used to be much more frequently however now only here and there. Not as often, maybe 2-3 times a month needed.   Barretts esophagus: recent diagnosis, egd 2023.   Tobacco abuse: for the past three weeks she has been able to decrease from 1.5 pack to about three cigarettes a day. She also does smoke marijuana a few times a week.   Wt Readings from Last 3 Encounters:  06/18/22 145 lb 2 oz (65.8 kg)  06/08/22 143 lb (64.9 kg)  05/20/22 141 lb 8 oz (64.2 kg)   S/p leep for positive pap 3/23 with ASCUS HPV 18,45+ in or, CINI with positive lateral margins, is supposed to f/u with her on November 30/2023.   With increasing anxiety in the last few months. She is currently stressed as she can not have short term disability. Notices she is worse with her emotions lately, and is having one thing after another. Does not currently see a therapist, does talk  In 2014.    Past Medical  History:  Diagnosis Date   Anemia    Anxiety    Arthritis    Asthma    Cervical dysplasia    Chronic abdominal pain    Chronic diarrhea    takes prevalite   Depression    Diverticulosis    GERD (gastroesophageal reflux disease)    Seasonal allergies    Wears glasses     Past Surgical History:  Procedure Laterality Date   CERVICAL CONIZATION W/BX N/A 12/16/2021   Procedure: LEEP  CONIZATION CERVIX WITH BIOPSY;  Surgeon: [name], MD;  Location: [place];  Service: Gynecology;  Laterality: N/A;   CERVIX LESION DESTRUCTION  1995   CHOLECYSTECTOMY, LAPAROSCOPIC  01/04/2013   _0    COLONOSCOPY WITH PROPOFOL N/A 03/27/2021   Procedure: COLONOSCOPY WITH PROPOFOL;  Surgeon: Jonathon Bellows, MD;  Location: [facility] ENDOSCOPY;  Service: Gastroenterology;  Laterality: N/A;   ESOPHAGOGASTRODUODENOSCOPY (EGD) WITH PROPOFOL N/A 04/01/2022   Procedure: ESOPHAGOGASTRODUODENOSCOPY (EGD) WITH PROPOFOL;  Surgeon: Jonathon Bellows, MD;  Location: [facility] ENDOSCOPY;  Service: Gastroenterology;  Laterality: N/A;   PR ENDOMET BIOPSY DONE W/COLPOSCOPY  11/12/2021   TONSILLECTOMY  1972   TUBAL LIGATION Bilateral 1996    Family History  Problem Relation Age of Onset   Hypertension Mother    Alzheimer's disease Mother    Diverticulitis Mother    Cancer Father  unknown type   Colon cancer Maternal Aunt    Breast cancer Maternal Aunt    Heart failure Maternal Uncle    Stomach cancer Paternal Aunt    Brain cancer Paternal Aunt    Breast cancer Paternal Aunt     Social History   Socioeconomic History   Marital status: Married    Spouse name: Lysbeth Galas   Number of children: 2   Years of education: some college   Highest education level: Not on file  Occupational History    Employer: Engineer, water  Tobacco Use   Smoking status: Every Day    Packs/day: 0.25    Years: 25.00    Total pack years: 6.25    Types: Cigarettes   Smokeless tobacco: Never   Tobacco comments:    1 ppd  x 20 years, quit for 10 years, restarted 2018  Vaping Use   Vaping Use: Some days   Devices: per pt every few weeks , vapes whatever her son has at the time  Substance and Sexual Activity   Alcohol use: Not Currently    Comment: none since 2018   Drug use: Never   Sexual activity: Yes    Partners: Male    Birth control/protection: Post-menopausal, Surgical  Other Topics Concern   Not on file  Social History Narrative   06/13/20   From: the area   Living: with husband Lysbeth Galas (2019), with husband's cousin Photographer   Work: 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      Family: [place] and Horticulturist, commercial - 3 grandchildren      Enjoys: writing, drawing, playing with cats, time with grandchildren      Exercise: not currently   Diet: nausea impacts diet      Safety   Seat belts: Yes    Guns: Yes  and secure   Safe in relationships: Yes    Social Determinants of Health   Financial Resource Strain: Not on file  Food Insecurity: Not on file  Transportation Needs: Not on file  Physical Activity: Not on file  Stress: Not on file  Social Connections: Not on file  Intimate Partner Violence: Not on file    Outpatient Medications Prior to Visit  Medication Sig Dispense Refill   acetaminophen (TYLENOL) 500 MG tablet Take 500 mg by mouth every 6 (six) hours as needed.     acetaminophen-codeine (TYLENOL #3) 300-30 MG tablet Take 1 tablet by mouth every 6 (six) hours as needed for moderate pain or severe pain. 20 tablet 0   albuterol (VENTOLIN HFA) 108 (90 Base) MCG/ACT inhaler Inhale 2 puffs into the lungs every 6 (six) hours as needed for wheezing or shortness of breath. 8 g 2   buPROPion (WELLBUTRIN XL) 150 MG 24 hr tablet Take 1 tablet (150 mg total) by mouth daily. 90 tablet 0   Cholecalciferol (VITAMIN D-3 PO) Take by mouth daily.     cholestyramine light (PREVALITE) 4 g packet Take 1 packet (4 g total) by mouth daily. 90 packet 3   Cyanocobalamin (B-12 PO) Take by mouth  daily.     diclofenac Sodium (VOLTAREN) 1 % GEL Apply 4 g topically 4 (four) times daily. 500 g 11   Multiple Vitamin (MULTIVITAMIN) tablet Take 1 tablet by mouth daily.     omeprazole (PRILOSEC) 20 MG capsule Take 1 capsule (20 mg total) by mouth 2 (two) times daily before a meal. 60 capsule 5   ondansetron (ZOFRAN-ODT)  4 MG disintegrating tablet TAKE ONE TABLET EVERY EIGHT HOURS AS NEEDED FOR NAUSEA AND VOMITING 20 tablet 0   No facility-administered medications prior to visit.    Allergies  Allergen Reactions   Hydrocodone Nausea And Vomiting    N/v when doesn't eat        Objective:    Physical Exam Vitals reviewed.  Constitutional:      General: She is not in acute distress.    Appearance: Normal appearance. She is not ill-appearing or toxic-appearing.  HENT:     Right Ear: Tympanic membrane normal.     Left Ear: Tympanic membrane normal.     Mouth/Throat:     Mouth: Mucous membranes are moist.     Pharynx: No pharyngeal swelling.     Tonsils: No tonsillar exudate.  Eyes:     Extraocular Movements: Extraocular movements intact.     Conjunctiva/sclera: Conjunctivae normal.     Pupils: Pupils are equal, round, and reactive to light.  Neck:     Thyroid: No thyroid mass.  Cardiovascular:     Rate and Rhythm: Normal rate and regular rhythm.  Pulmonary:     Effort: Pulmonary effort is normal.     Breath sounds: Normal breath sounds.  Musculoskeletal:        General: Normal range of motion.  Lymphadenopathy:     Cervical:     Right cervical: No superficial cervical adenopathy.    Left cervical: No superficial cervical adenopathy.  Skin:    General: Skin is warm.     Capillary Refill: Capillary refill takes less than 2 seconds.  Neurological:     General: No focal deficit present.     Mental Status: She is alert and oriented to person, place, and time.  Psychiatric:        Mood and Affect: Mood normal.        Behavior: Behavior normal.        Thought Content:  Thought content normal.        Judgment: Judgment normal.     BP 132/66   Pulse 81   Temp 98.2 F (36.8 C)   Resp 16   Ht _0  (1.6 m)   Wt 145 lb 2 oz (65.8 kg)   LMP 12/01/2012 (Exact Date)   SpO2 98%   BMI 25.71 kg/m  Wt Readings from Last 3 Encounters:  06/18/22 145 lb 2 oz (65.8 kg)  06/08/22 143 lb (64.9 kg)  05/20/22 141 lb 8 oz (64.2 kg)     Health Maintenance Due  Topic Date Due   TETANUS/TDAP  Never done   MAMMOGRAM  Never done   Zoster Vaccines- Shingrix (1 of 2) Never done    There are no preventive care reminders to display for this patient.  Lab Results  Component Value Date   TSH 1.260 03/03/2022   Lab Results  Component Value Date   WBC 9.0 06/08/2022   HGB 13.0 06/08/2022   HCT 39.8 06/08/2022   MCV 101.8 (H) 06/08/2022   PLT 182 06/08/2022   Lab Results  Component Value Date   NA 140 06/08/2022   K 3.8 06/08/2022   CO2 29 06/08/2022   GLUCOSE 87 06/08/2022   BUN 23 (H) 06/08/2022   CREATININE 0.91 06/08/2022   BILITOT 0.6 06/08/2022   ALKPHOS 66 06/08/2022   AST 19 06/08/2022   ALT 21 06/08/2022   PROT 7.5 06/08/2022   ALBUMIN 4.4 06/08/2022   CALCIUM 9.3 06/08/2022   ANIONGAP  8 06/08/2022   EGFR 89 10/23/2021   GFR 79.63 01/15/2021   Lab Results  Component Value Date   HGBA1C 5.2 10/23/2021      Assessment & Plan:   Problem List Items Addressed This Visit       Digestive   Barrett's esophagus    Handout given to patient with more details on this dx  Pt to continue with omeprazole 20 mg twice daily.  Try to decrease and or avoid spicy foods, fried fatty foods, and also caffeine and chocolate as these can increase heartburn symptoms.          Nervous and Auditory   Cervical radiculopathy    Reviewed note with Dr. Lorelei Pont as well as recent imaging MRI 11/23. Looked up referral and gave pt phone number to get scheduled.       Relevant Medications   sertraline (ZOLOFT) 50 MG tablet     Genitourinary   Dysplasia  of cervix, high grade CIN 2    S/p leep  Read Dr. Virginia Crews notice, pt due for repeat pap 07/02/22  Pt states she will call to schedule        Other   Generalized anxiety disorder - Primary    Trial start zoloft  Continue wellbutrin 150 mg  Also suggested therapy, referral for psychology placed.   I instructed pt to start zoloft 50 mg 1/2 tablet once daily for 1 week and then increase to a full tablet once daily on week two as tolerated.  We discussed common side effects such as nausea, drowsiness and weight gain.  Also discussed rare but serious side effect of suicidal ideation.  She is instructed to discontinue medication and go directly to ED if this occurs.  Pt verbalizes understanding.  Plan is to follow up in 30 days to evaluate progress.          Relevant Medications   sertraline (ZOLOFT) 50 MG tablet   Other Relevant Orders   Ambulatory referral to Psychology   Major depressive disorder   Relevant Medications   sertraline (ZOLOFT) 50 MG tablet   Other Relevant Orders   Ambulatory referral to Psychology   Tobacco abuse    Smoking cessation instruction/counseling given:  counseled patient on the dangers of tobacco use, advised patient to stop smoking, and reviewed strategies to maximize success  D/w pt that her abdominal discomfort and pain may be a result of marijuana, tobacco use.        Abnormal ultrasound of uterus    Reviewed, pt to f/u with gyn   Extensive time spent with patient totaling 48 minutes in office. We went over ER visit , look at visit summary and went over discharge medications as well as reviewed MRI abdomen and pelvic u/s 5/23. Reviewed obgyn notes from Dr. Darron Doom, and Dr. Atlee Abide. Reviewed Leep results. Time also spent going over multiple acute patient concerns.        Meds ordered this encounter  Medications   sertraline (ZOLOFT) 50 MG tablet    Sig: Take 1 tablet (50 mg total) by mouth daily.    Dispense:  30 tablet    Refill:  3     Order Specific Question:   Supervising Provider    Answer:   Diona Browner, AMY E [[id number]]    Follow-up: Return in about 6 weeks (around 07/30/2022) for f/u anxiety.    Eugenia Pancoast, FNP

## 2022-06-18 NOTE — Assessment & Plan Note (Signed)
Handout given to patient with more details on this dx  Pt to continue with omeprazole 20 mg twice daily.  Try to decrease and or avoid spicy foods, fried fatty foods, and also caffeine and chocolate as these can increase heartburn symptoms.

## 2022-06-18 NOTE — Assessment & Plan Note (Signed)
S/p leep  Read Dr. Tawni Levy notice, pt due for repeat pap 07/02/22  Pt states she will call to schedule

## 2022-06-18 NOTE — Assessment & Plan Note (Signed)
Smoking cessation instruction/counseling given:  counseled patient on the dangers of tobacco use, advised patient to stop smoking, and reviewed strategies to maximize success  D/w pt that her abdominal discomfort and pain may be a result of marijuana, tobacco use.

## 2022-06-18 NOTE — Assessment & Plan Note (Addendum)
Reviewed, pt to f/u with gyn   Extensive time spent with patient totaling 48 minutes in office. We went over ER visit , look at visit summary and went over discharge medications as well as reviewed MRI abdomen and pelvic u/s 5/23. Reviewed obgyn notes from Dr. Tinnie Gens, and Dr. Emelda Fear. Reviewed Leep results. Time also spent going over multiple acute patient concerns.

## 2022-06-18 NOTE — Assessment & Plan Note (Addendum)
Trial start zoloft  Continue wellbutrin 150 mg  Also suggested therapy, referral for psychology placed.   I instructed pt to start zoloft 50 mg 1/2 tablet once daily for 1 week and then increase to a full tablet once daily on week two as tolerated.  We discussed common side effects such as nausea, drowsiness and weight gain.  Also discussed rare but serious side effect of suicidal ideation.  She is instructed to discontinue medication and go directly to ED if this occurs.  Pt verbalizes understanding.  Plan is to follow up in 30 days to evaluate progress.

## 2022-06-23 ENCOUNTER — Telehealth: Payer: Self-pay

## 2022-06-23 ENCOUNTER — Other Ambulatory Visit: Payer: Self-pay | Admitting: Family

## 2022-06-23 DIAGNOSIS — M5412 Radiculopathy, cervical region: Secondary | ICD-10-CM

## 2022-06-23 NOTE — Telephone Encounter (Signed)
Caller Name: Falan Call back phone #: [phone]  MEDICATION(S):  acetaminophen-codeine (TYLENOL #3) 300-30 MG tablet   Days of Med Remaining: 0  Has the patient contacted their pharmacy (YES/NO)? NO What did pharmacy advise?   Preferred [facility]:  [facility]   ~~~Please advise patient/caregiver to allow 2-3 business days to process RX refills.   Pt stated she has "acetaminophen (TYLENOL) 500 MG" tablet but it isn't working for pain.

## 2022-06-23 NOTE — Telephone Encounter (Signed)
This message was sent to Dr. Patsy Lager as well to manage pain medications since he was the one who saw her and prescribed the Tylenol #3.  He is out of the office today and I advised Mr. Mitchum I would call them back once he responds to the message.  Patient has tried all the over the counter medications that Tabitha recommended below without relief.  Will wait for Dr. Cyndie Chime recommendations until patient can see the Surgeon on 07/09/22.

## 2022-06-23 NOTE — Telephone Encounter (Signed)
See phone note prior to refilling Rx.

## 2022-06-23 NOTE — Telephone Encounter (Signed)
I spoke with pt and pts husband (DPR signed) at same time on speaker phone. Pt said she has appt with surgeon on 07/09/22. 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 Pt said the neck pain is radiating to the lt shoulder and the lt hand is numb. Pt said she has to take the Tylenol #3 taking two tabs q 6 h to get any relief. Pt wants to know if Dr Patsy Lager can refill the Tylenol # 3 with instructions to take two tabs q 6 h prn for pain and increase the quantity pt can receive. Pts husband wanted Dr Patsy Lager to be aware so far has used 6 tubes of Diclofenac gel; applies to pt before going to work, when comes home from work and during the night along with using the heating pad. Pt said the diclofenac does temporarily help the pain but pain relief only last brief period.  [facility]. Pt said does not need cb unless Dr Patsy Lager has new instructions for pt. [facility] if [facility] was sent in. Sending note to Dr Patsy Lager, [place] [facility] and [place] [facility].

## 2022-06-23 NOTE — Telephone Encounter (Signed)
Last office visit 06/18/22 with Dugal for TOC.  Last refilled 06/08/22 for #20 with no refills by Dr. Patsy Lager.  I have forward other phone note back to [employer] since she is the one who talked to Mr. Eddington for clarification.  Next Appt:  08/05/22 with Dugal for follow up anxiety.

## 2022-06-23 NOTE — Telephone Encounter (Signed)
I am a little bit confused here.  Is this a refill request, a request for stronger pain medication?  I am not sure what tubes of inflammation tubes means.

## 2022-06-23 NOTE — Telephone Encounter (Signed)
I recommend taking ibuprofen 400 mg along with tylenol 500 mg every 6 hours for pain as well as lidocaine patches (over the counter - obtain 4% patches). Ibuprofen will also continue to assist with pleurisy recovery as well for inflammation. Voltaren gel also prn. Heat/ice. Alternate heat regimens for more comfort.   Please advise pt if needs more urgent assessment for worsening pain, to go to [facility].

## 2022-06-24 MED ORDER — ACETAMINOPHEN-CODEINE 300-30 MG PO TABS: 1.0000 | ORAL_TABLET | Freq: Four times a day (QID) | ORAL | 0 refills | Status: DC | PRN

## 2022-06-24 NOTE — Telephone Encounter (Signed)
Please call back  It is fine to use more - I increased the quantity to 50.  I hope that will be enough to manage the pain until her surgical appointment.

## 2022-06-24 NOTE — Telephone Encounter (Signed)
Kathy Garcia notified as instructed by telephone. 

## 2022-07-08 NOTE — Progress Notes (Deleted)
Referring Physician:  Owens Loffler, MD [place],  [place] [address]  Primary Physician:  Eugenia Pancoast, [place]  History of Present Illness: 07/08/2022 Kathy Garcia is here today with a chief complaint of ***  posterior neck pain that radiates into the bilateral arms, left side is worse than the right.   Any numbness or tingling in the arms?  Duration: ***February 2023? Location: *** Quality: ***tight, stiff  Severity: *** 10/10? Precipitating: aggravated by ***turning her neck, sitting, laying down Modifying factors: made better by ***nothing? Weakness: none Timing: ***constant Bowel/Bladder Dysfunction: none  Conservative measures:  Physical therapy: *** has participated in?? Multimodal medical therapy including regular antiinflammatories: *** tylenol-codeine, ibuprofen, prednisone Injections: *** has not received epidural steroid injections  Past Surgery: ***denies  Kathy Garcia has ***no symptoms of cervical myelopathy.  The symptoms are causing a significant impact on the patient's life.   I have utilized the care everywhere function in epic to review the outside records available from external health systems.  Review of Systems:  A 10 point review of systems is negative, except for the pertinent positives and negatives detailed in the HPI.  Past Medical History: Past Medical History:  Diagnosis Date   Anemia    Anxiety    Arthritis    Asthma    Cervical dysplasia    Chronic abdominal pain    Chronic diarrhea    takes prevalite   Depression    Diverticulosis    GERD (gastroesophageal reflux disease)    Seasonal allergies    Wears glasses     Past Surgical History: Past Surgical History:  Procedure Laterality Date   CERVICAL CONIZATION W/BX N/A 12/16/2021   Procedure: LEEP  CONIZATION CERVIX WITH BIOPSY;  Surgeon: [name], MD;  Location: [facility];  Service: Gynecology;  Laterality: N/A;    CERVIX LESION DESTRUCTION  1995   CHOLECYSTECTOMY, LAPAROSCOPIC  01/04/2013   @[facility]    COLONOSCOPY WITH PROPOFOL N/A 03/27/2021   Procedure: COLONOSCOPY WITH PROPOFOL;  Surgeon: Jonathon Bellows, MD;  Location: [facility] ENDOSCOPY;  Service: Gastroenterology;  Laterality: N/A;   ESOPHAGOGASTRODUODENOSCOPY (EGD) WITH PROPOFOL N/A 04/01/2022   Procedure: ESOPHAGOGASTRODUODENOSCOPY (EGD) WITH PROPOFOL;  Surgeon: Jonathon Bellows, MD;  Location: [facility] ENDOSCOPY;  Service: Gastroenterology;  Laterality: N/A;   PR ENDOMET BIOPSY DONE W/COLPOSCOPY  11/12/2021   TONSILLECTOMY  1972   TUBAL LIGATION Bilateral 1996    Allergies: Allergies as of 07/09/2022 - Review Complete 06/18/2022  Allergen Reaction Noted   Hydrocodone Nausea And Vomiting 01/03/2013    Medications: No outpatient medications have been marked as taking for the 07/09/22 encounter (Appointment) with Meade Maw, MD.    Social History: Social History   Tobacco Use   Smoking status: Every Day    Packs/day: 0.25    Years: 25.00    Total pack years: 6.25    Types: Cigarettes   Smokeless tobacco: Never   Tobacco comments:    1 ppd x 20 years, quit for 10 years, restarted 2018  Vaping Use   Vaping Use: Some days   Devices: per pt every few weeks , vapes whatever her son has at the time  Substance Use Topics   Alcohol use: Not Currently    Comment: none since 2018   Drug use: Never    Family Medical History: Family History  Problem Relation Age of Onset   Hypertension Mother    Alzheimer's disease Mother    Diverticulitis Mother  Cancer Father        unknown type   Colon cancer Maternal Aunt    Breast cancer Maternal Aunt    Heart failure Maternal Uncle    Stomach cancer Paternal Aunt    Brain cancer Paternal Aunt    Breast cancer Paternal Aunt     Physical Examination: There were no vitals filed for this visit.  General: Patient is well developed, well nourished, calm, collected, and in no apparent distress.  Attention to examination is appropriate.  Neck:   Supple.  Full range of motion.  Respiratory: Patient is breathing without any difficulty.   NEUROLOGICAL:     Awake, alert, oriented to person, place, and time.  Speech is clear and fluent. Fund of knowledge is appropriate.   Cranial Nerves: Pupils equal round and reactive to light.  Facial tone is symmetric.  Facial sensation is symmetric. Shoulder shrug is symmetric. Tongue protrusion is midline.  There is no pronator drift.  ROM of spine: full.    Strength: Side Biceps Triceps Deltoid Interossei Grip Wrist Ext. Wrist Flex.  R 5 5 5 5 5 5 5   L 5 5 5 5 5 5 5    Side Iliopsoas Quads Hamstring PF DF EHL  R 5 5 5 5 5 5   L 5 5 5 5 5 5    Reflexes are ***2+ and symmetric at the biceps, triceps, brachioradialis, patella and achilles.   Hoffman's is absent.   Bilateral upper and lower extremity sensation is intact to light touch.    No evidence of dysmetria noted.  Gait is normal.     Medical Decision Making  Imaging: ***  I have personally reviewed the images and agree with the above interpretation.  Assessment and Plan: Kathy Garcia is a pleasant 56 y.o. female with ***    Thank you for involving me in the care of this patient.      Chester K. MD, [facility]

## 2022-07-09 ENCOUNTER — Telehealth: Payer: Self-pay | Admitting: Family

## 2022-07-09 ENCOUNTER — Ambulatory Visit: Payer: No Typology Code available for payment source | Admitting: Obstetrics & Gynecology

## 2022-07-09 ENCOUNTER — Ambulatory Visit: Payer: 59 | Admitting: Neurosurgery

## 2022-07-09 DIAGNOSIS — M5412 Radiculopathy, cervical region: Secondary | ICD-10-CM

## 2022-07-09 NOTE — Telephone Encounter (Signed)
  Encourage patient to contact the pharmacy for refills or they can request refills through [facility]  Did the patient contact the pharmacy: yes    LAST APPOINTMENT DATE: 06/18/22  NEXT APPOINTMENT DATE:  MEDICATION:acetaminophen-codeine (TYLENOL #3) 300-30 MG tablet   Is the patient out of medication? yes  If not, how much is left?  Is this a 90 day supply: 50  PHARMAC[facility]: [id number] [address] [facility], [place] [address] Phone: [phone]  Fax: [phone]      Let patient know to contact pharmacy at the end of the day to make sure medication is ready.  Please notify patient to allow 48-72 hours to process

## 2022-07-09 NOTE — Telephone Encounter (Signed)
Sent a request to [employer]

## 2022-07-09 NOTE — Telephone Encounter (Signed)
Patient would like to know if she can receive a refill of medication acetaminophen-codeine (TYLENOL #3) 300-30 MG tablet  ? She stated that she is already out of rx that was filled on 06/24/22 for her I sent a refill request back as well.

## 2022-07-10 MED ORDER — ACETAMINOPHEN-CODEINE 300-30 MG PO TABS: 1.0000 | ORAL_TABLET | Freq: Four times a day (QID) | ORAL | 0 refills | Status: DC | PRN

## 2022-07-10 NOTE — Telephone Encounter (Signed)
Pt's husband, Sheria Lang asking for the status of the med refill. Sheria Lang mentioned the pt returns back to work today, 07/10/22 & she is still very much in pain. Call back # [phone]

## 2022-07-10 NOTE — Telephone Encounter (Signed)
Last office visit 06/18/22 with  Dugal for TOC.  Last refilled 06/24/22 for #50 with no refills by Dr. Patsy Lager.   Next Appt: 08/05/2021 with Dugal for anxiety.

## 2022-07-10 NOTE — Telephone Encounter (Signed)
Refilled - can you let them know.  She is seeing Neurosurgery soon, and I would expect them to help her manage her pain and pain medications.   Best wishes.

## 2022-07-10 NOTE — Telephone Encounter (Signed)
Sheria Lang notified as instructed by telephone.  Kathy Garcia was scheduled to see the Neurosurgeon yesterday but got her appointment time mixed up so now she is scheduled to see them on 07/21/22.  FYI to Dr. Patsy Lager.

## 2022-07-16 ENCOUNTER — Ambulatory Visit: Payer: No Typology Code available for payment source | Admitting: Gastroenterology

## 2022-07-20 NOTE — Progress Notes (Unsigned)
Referring Physician:  Hannah Beat, MD [address] [place],  [place] [address]  Primary Physician:  Mort Sawyers, FNP  History of Present Illness: 07/21/2022 [name] is here today with a chief complaint of neck pain that radiates into both of her arms.  Her pain is worse on the left side than the right.  She has pain into her shoulder blades on both sides.  She has numbness in her hands.  Her pain has been ongoing since February 2023.  It is as bad as 9 out of 10.  Movement, turning her neck, sitting, and laying down make it worse.  She has substantial physical component of her work, which makes her pain worse.    Bowel/Bladder Dysfunction: none  Conservative measures: heating pad Physical therapy:  has not participated Multimodal medical therapy including regular antiinflammatories:  tylenol-codeine, ibuprofen, prednisone  Injections:  has not received epidural steroid injections  Past Surgery: denies   Kathy Garcia has no clear symptoms of cervical myelopathy.  The symptoms are causing a significant impact on the patient's life.   I have utilized the care everywhere function in epic to review the outside records available from external health systems.  Review of Systems:  A 10 point review of systems is negative, except for the pertinent positives and negatives detailed in the HPI.  Past Medical History: Past Medical History:  Diagnosis Date   Anemia    Anxiety    Arthritis    Asthma    Cervical dysplasia    Chronic abdominal pain    Chronic diarrhea    takes prevalite   Depression    Diverticulosis    GERD (gastroesophageal reflux disease)    Seasonal allergies    Wears glasses     Past Surgical History: Past Surgical History:  Procedure Laterality Date   CERVICAL CONIZATION W/BX N/A 12/16/2021   Procedure: LEEP  CONIZATION CERVIX WITH BIOPSY;  Surgeon: Reva Bores, MD;  Location: [facility] ;  Service:  Gynecology;  Laterality: N/A;   CERVIX LESION DESTRUCTION  1995   CHOLECYSTECTOMY, LAPAROSCOPIC  01/04/2013   @[facility]    COLONOSCOPY WITH PROPOFOL N/A 03/27/2021   Procedure: COLONOSCOPY WITH PROPOFOL;  Surgeon: 03/29/2021, MD;  Location: [facility] ENDOSCOPY;  Service: Gastroenterology;  Laterality: N/A;   ESOPHAGOGASTRODUODENOSCOPY (EGD) WITH PROPOFOL N/A 04/01/2022   Procedure: ESOPHAGOGASTRODUODENOSCOPY (EGD) WITH PROPOFOL;  Surgeon: 04/03/2022, MD;  Location: [facility] ENDOSCOPY;  Service: Gastroenterology;  Laterality: N/A;   PR ENDOMET BIOPSY DONE W/COLPOSCOPY  11/12/2021   TONSILLECTOMY  1972   TUBAL LIGATION Bilateral 1996    Allergies: Allergies as of 07/21/2022 - Review Complete 07/21/2022  Allergen Reaction Noted   Hydrocodone Nausea And Vomiting 01/03/2013    Medications: No outpatient medications have been marked as taking for the 07/21/22 encounter (Office Visit) with 07/23/22, MD.    Social History: Social History   Tobacco Use   Smoking status: Every Day    Packs/day: 0.25    Years: 25.00    Total pack years: 6.25    Types: Cigarettes   Smokeless tobacco: Never   Tobacco comments:    1 ppd x 20 years, quit for 10 years, restarted 2018  Vaping Use   Vaping Use: Former  Substance Use Topics   Alcohol use: Not Currently    Comment: none since 2018   Drug use: Never    Family Medical History: Family History  Problem Relation Age of Onset  Hypertension Mother    Alzheimer's disease Mother    Diverticulitis Mother    Cancer Father        unknown type   Colon cancer Maternal Aunt    Breast cancer Maternal Aunt    Heart failure Maternal Uncle    Stomach cancer Paternal Aunt    Brain cancer Paternal Aunt    Breast cancer Paternal Aunt     Physical Examination: Vitals:   07/21/22 0916  BP: (!) 151/90  Pulse: 82    General: Patient is well developed, well nourished, calm, collected, and in no apparent distress. Attention to examination is  appropriate.  Neck:   Supple.  Full range of motion.  Respiratory: Patient is breathing without any difficulty.   NEUROLOGICAL:     Awake, alert, oriented to person, place, and time.  Speech is clear and fluent. Fund of knowledge is appropriate.   Cranial Nerves: Pupils equal round and reactive to light.  Facial tone is symmetric.  Facial sensation is symmetric. Shoulder shrug is symmetric. Tongue protrusion is midline.  There is no pronator drift.  ROM of spine: full.    Strength: Side Biceps Triceps Deltoid Interossei Grip Wrist Ext. Wrist Flex.  R 5 5 4+ 5 5 5 5   L 5 4+ 4+ 5 5 5 5    Side Iliopsoas Quads Hamstring PF DF EHL  R 5 5 5 5 5 5   L 5 5 5 5 5 5    Reflexes are 2+ and symmetric at the biceps, triceps, brachioradialis, patella and achilles.   Hoffman's is absent.   Bilateral upper and lower extremity sensation is intact to light touch.    No evidence of dysmetria noted.  Gait is normal.     Medical Decision Making  Imaging: MRI C spine 06/03/2022  IMPRESSION: 1. At C3-4 there is severe left facet arthropathy. Moderate left foraminal stenosis. 2. At C4-5 there is a mild broad-based disc bulge. Severe left facet arthropathy. Left uncovertebral degenerative changes. Severe left foraminal stenosis. 3. At C5-6 there is a broad-based disc osteophyte complex. Bilateral uncovertebral degenerative changes. Severe bilateral foraminal stenosis. Mild spinal stenosis. 4. At C6-7 there is a broad-based disc bulge. Bilateral uncovertebral degenerative changes. Severe bilateral foraminal stenosis, right worse than left. 5. No acute osseous injury of the cervical spine.     Electronically Signed   By: M.D.   On: 06/06/2022 10:00  I have personally reviewed the images and agree with the above interpretation.  Assessment and Plan: Kathy Garcia is a pleasant 56 y.o. 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  I recommended she start with physical therapy and injections.  Will make those referrals.  We also discussed a multimodal regimen to try to decrease her discomfort.  This includes gabapentin, naproxen, and muscle relaxants.  I recommended against using narcotics, as we utilize that primarily for posttraumatic and postsurgical pain.  I will see her back in approximately 8 weeks after she has finished physical therapy.  If she is not better at that time, we will discuss C3-7 anterior cervical discectomy and fusion.  I spent a total of 30 minutes in this patient's care today. This time was spent reviewing pertinent records including imaging studies, obtaining and confirming history, performing a directed evaluation, formulating and discussing my recommendations, and documenting the visit within the medical record.      Thank you for involving me in the care  of this patient.      Annalycia Done K. Izora Ribas MD, [facility] Neurosurgery

## 2022-07-21 ENCOUNTER — Encounter: Payer: Self-pay | Admitting: Neurosurgery

## 2022-07-21 ENCOUNTER — Ambulatory Visit (INDEPENDENT_AMBULATORY_CARE_PROVIDER_SITE_OTHER): Payer: No Typology Code available for payment source | Admitting: Neurosurgery

## 2022-07-21 VITALS — BP 151/90 | HR 82 | Ht 63.0 in | Wt 148.4 lb

## 2022-07-21 DIAGNOSIS — M542 Cervicalgia: Secondary | ICD-10-CM | POA: Diagnosis not present

## 2022-07-21 DIAGNOSIS — M5412 Radiculopathy, cervical region: Secondary | ICD-10-CM | POA: Diagnosis not present

## 2022-07-21 DIAGNOSIS — M4312 Spondylolisthesis, cervical region: Secondary | ICD-10-CM | POA: Diagnosis not present

## 2022-07-21 MED ORDER — METHOCARBAMOL 500 MG PO TABS: 500.0000 mg | ORAL_TABLET | Freq: Four times a day (QID) | ORAL | 0 refills | Status: DC | PRN

## 2022-07-21 MED ORDER — GABAPENTIN 300 MG PO CAPS: 300.0000 mg | ORAL_CAPSULE | Freq: Three times a day (TID) | ORAL | 0 refills | Status: DC

## 2022-07-21 MED ORDER — NAPROXEN 500 MG PO TABS: 250.0000 mg | ORAL_TABLET | Freq: Two times a day (BID) | ORAL | 2 refills | Status: DC

## 2022-07-23 ENCOUNTER — Encounter: Payer: No Typology Code available for payment source | Admitting: Family

## 2022-08-05 ENCOUNTER — Ambulatory Visit (INDEPENDENT_AMBULATORY_CARE_PROVIDER_SITE_OTHER): Payer: No Typology Code available for payment source | Admitting: Family

## 2022-08-05 ENCOUNTER — Encounter: Payer: Self-pay | Admitting: Family

## 2022-08-05 VITALS — BP 130/74 | HR 77 | Temp 98.6°F | Ht 63.0 in | Wt 149.0 lb

## 2022-08-05 DIAGNOSIS — M545 Low back pain, unspecified: Secondary | ICD-10-CM | POA: Diagnosis not present

## 2022-08-05 DIAGNOSIS — M5412 Radiculopathy, cervical region: Secondary | ICD-10-CM | POA: Diagnosis not present

## 2022-08-05 DIAGNOSIS — G894 Chronic pain syndrome: Secondary | ICD-10-CM

## 2022-08-05 DIAGNOSIS — F411 Generalized anxiety disorder: Secondary | ICD-10-CM | POA: Diagnosis not present

## 2022-08-05 DIAGNOSIS — Z91141 Patient's other noncompliance with medication regimen due to financial hardship: Secondary | ICD-10-CM

## 2022-08-05 DIAGNOSIS — J0110 Acute frontal sinusitis, unspecified: Secondary | ICD-10-CM

## 2022-08-05 DIAGNOSIS — F331 Major depressive disorder, recurrent, moderate: Secondary | ICD-10-CM

## 2022-08-05 MED ORDER — BUPROPION HCL ER (XL) 150 MG PO TB24: 150.0000 mg | ORAL_TABLET | Freq: Every day | ORAL | 2 refills | Status: AC

## 2022-08-05 MED ORDER — PREDNISONE 20 MG PO TABS: ORAL_TABLET | ORAL | 0 refills | Status: DC

## 2022-08-05 MED ORDER — SERTRALINE HCL 50 MG PO TABS: 50.0000 mg | ORAL_TABLET | Freq: Every day | ORAL | 2 refills | Status: DC

## 2022-08-05 MED ORDER — AMOXICILLIN-POT CLAVULANATE 875-125 MG PO TABS: 1.0000 | ORAL_TABLET | Freq: Two times a day (BID) | ORAL | 0 refills | Status: DC

## 2022-08-05 NOTE — Progress Notes (Signed)
Established Patient Office Visit  Subjective:  Patient ID: Kathy Garcia, female    DOB: Oct 10, 1965  Age: 57 y.o. MRN: [id number]  CC:  Chief Complaint  Patient presents with   Anxiety    Has been out of medications and feels like anxiety has increased due to financial issues    Back Pain    Would like referral for second opinion and pt     HPI Kathy Garcia is here today for follow up.   Pt recently seen for cervical radiculopathy with Dr. Cari Caraway. Was assessed and diagnosed with multilevel cervical radiculopathy due to severe neuro-foraminal stenosis. Recommended to start with physical therapy and injections, also discussed with her different pain management modalities to include gabapentin, naproxen, and muscle relaxants. Recommended against using narcotics for the pain via his office note. F/u was due for 8 weeks post finishing PT therapy, then if no improvement would consider surgical intervention.   Anxiety: has been worsening. She has ran out of her medications and states her anxiety is escalating. She was taking wellbutrin XL 150 mg once daily as well as sertraline 50 mg once daily. She does feel her anxiety and restlessness is due more to pain and discomfort with her cervical concerns.   Wt Readings from Last 3 Encounters:  08/05/22 149 lb (67.6 kg)  07/21/22 148 lb 6.4 oz (67.3 kg)  06/18/22 145 lb 2 oz (65.8 kg)   Over the last two weeks with nasal congestion, sinus pressure, and ears are popping. Cough that is non productive and without wheezing. Slight chest congestion. No sore throat.       08/05/2022   10:58 AM 03/12/2022   12:19 PM 02/12/2022    9:28 AM  PHQ9 SCORE ONLY  PHQ-9 Total Score _0 08/05/2022   10:59 AM 03/12/2022   12:19 PM 02/12/2022    9:28 AM 06/13/2020   12:40 PM  GAD 7 : Generalized Anxiety Score  Nervous, Anxious, on Edge _1 Control/stop worrying 0 0 3 3  Worry too much - different things _2 Trouble relaxing _3 Restless 1 0 2 1  Easily annoyed or irritable _4 Afraid - awful might happen 0 0 2 1  Total GAD 7 Score _5 Anxiety Difficulty Not difficult at all Somewhat difficult Somewhat difficult Very difficult     Past Medical History:  Diagnosis Date   Anemia    Anxiety    Arthritis    Asthma    Cervical dysplasia    Chronic abdominal pain    Chronic diarrhea    takes prevalite   Depression    Diverticulosis    GERD (gastroesophageal reflux disease)    Seasonal allergies    Wears glasses     Past Surgical History:  Procedure Laterality Date   CERVICAL CONIZATION W/BX N/A 12/16/2021   Procedure: LEEP  CONIZATION CERVIX WITH BIOPSY;  Surgeon: [name], MD;  Location: [place];  Service: Gynecology;  Laterality: N/A;   CERVIX LESION DESTRUCTION  1995   CHOLECYSTECTOMY, LAPAROSCOPIC  01/04/2013   _6    COLONOSCOPY WITH PROPOFOL N/A 03/27/2021   Procedure: COLONOSCOPY WITH PROPOFOL;  Surgeon: Jonathon Bellows, MD;  Location[facility] ENDOSCOPY;  Service: Gastroenterology;  Laterality: N/A;   ESOPHAGOGASTRODUODENOSCOPY (EGD) WITH PROPOFOL N/A 04/01/2022   Procedure: ESOPHAGOGASTRODUODENOSCOPY (EGD) WITH PROPOFOL;  Surgeon: Vicente Males,  Bailey Mech, MD;  Location: [facility];  Service: Gastroenterology;  Laterality: N/A;   PR ENDOMETRIAL BX CONJUNCT W/COLPOSCOPY  11/12/2021   TONSILLECTOMY  1972   TUBAL LIGATION Bilateral 1996    Family History  Problem Relation Age of Onset   Hypertension Mother    Alzheimer's disease Mother    Diverticulitis Mother    Cancer Father        unknown type   Colon cancer Maternal Aunt    Breast cancer Maternal Aunt    Heart failure Maternal Uncle    Stomach cancer Paternal Aunt    Brain cancer Paternal Aunt    Breast cancer Paternal Aunt     Social History   Socioeconomic History   Marital status: Married    Spouse name: Kathy Garcia   Number of children: 2   Years of education: some college   Highest education level:  Not on file  Occupational History    Employer: Engineer, [employer]  Tobacco Use   Smoking status: Every Day    Packs/day: 0.25    Years: 25.00    Total pack years: 6.25    Types: Cigarettes   Smokeless tobacco: Never   Tobacco comments:    1 ppd x 20 years, quit for 10 years, restarted 2018  Vaping Use   Vaping Use: Former  Substance and Sexual Activity   Alcohol use: Not Currently    Comment: none since 2018   Drug use: Never   Sexual activity: Yes    Partners: Male    Birth control/protection: Post-menopausal, Surgical  Other Topics Concern   Not on file  Social History Narrative   06/13/20   From: the area   Living: with husband Kathy Garcia (2019), with husband's cousin Photographer   Work: 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      Family: [place] and Horticulturist, commercial - 3 grandchildren      Enjoys: writing, drawing, playing with cats, time with grandchildren      Exercise: not currently   Diet: nausea impacts diet      Safety   Seat belts: Yes    Guns: Yes  and secure   Safe in relationships: Yes    Social Determinants of Health   Financial Resource Strain: Not on file  Food Insecurity: Not on file  Transportation Needs: Not on file  Physical Activity: Not on file  Stress: Not on file  Social Connections: Not on file  Intimate Partner Violence: Not on file    Outpatient Medications Prior to Visit  Medication Sig Dispense Refill   acetaminophen (TYLENOL) 500 MG tablet Take 500 mg by mouth every 6 (six) hours as needed.     albuterol (VENTOLIN HFA) 108 (90 Base) MCG/ACT inhaler Inhale 2 puffs into the lungs every 6 (six) hours as needed for wheezing or shortness of breath. 8 g 2   cholestyramine light (PREVALITE) 4 g packet Take 1 packet (4 g total) by mouth daily. 90 packet 3   diclofenac Sodium (VOLTAREN) 1 % GEL Apply 4 g topically 4 (four) times daily. 500 g 11   gabapentin (NEURONTIN) 300 MG capsule Take 1 capsule (300 mg total) by mouth 3 (three) times  daily. 120 capsule 0   methocarbamol (ROBAXIN) 500 MG tablet Take 1 tablet (500 mg total) by mouth every 6 (six) hours as needed for muscle spasms. 120 tablet 0   Multiple Vitamin (MULTIVITAMIN) tablet Take 1 tablet by mouth daily.  naproxen (NAPROSYN) 500 MG tablet Take 0.5 tablets (250 mg total) by mouth 2 (two) times daily with a meal. 30 tablet 2   omeprazole (PRILOSEC) 20 MG capsule Take 1 capsule (20 mg total) by mouth 2 (two) times daily before a meal. 60 capsule 5   ondansetron (ZOFRAN-ODT) 4 MG disintegrating tablet TAKE ONE TABLET EVERY EIGHT HOURS AS NEEDED FOR NAUSEA AND VOMITING 20 tablet 0   acetaminophen-codeine (TYLENOL #3) 300-30 MG tablet Take 1-2 tablets by mouth every 6 (six) hours as needed for moderate pain or severe pain. 50 tablet 0   buPROPion (WELLBUTRIN XL) 150 MG 24 hr tablet Take 1 tablet (150 mg total) by mouth daily. 90 tablet 0   sertraline (ZOLOFT) 50 MG tablet Take 1 tablet (50 mg total) by mouth daily. 30 tablet 3   Cholecalciferol (VITAMIN D-3 PO) Take by mouth daily. (Patient not taking: Reported on 08/05/2022)     Cyanocobalamin (B-12 PO) Take by mouth daily. (Patient not taking: Reported on 08/05/2022)     No facility-administered medications prior to visit.    Allergies  Allergen Reactions   Hydrocodone Nausea And Vomiting    N/v when doesn't eat        Objective:    Physical Exam Constitutional:      General: She is not in acute distress.    Appearance: Normal appearance. She is not ill-appearing.  HENT:     Right Ear: Tympanic membrane normal.     Left Ear: Tympanic membrane normal.     Nose: Congestion present. No rhinorrhea.     Right Turbinates: Swollen. Not enlarged.     Left Turbinates: Swollen. Not enlarged.     Right Sinus: Frontal sinus tenderness present. No maxillary sinus tenderness.     Left Sinus: Frontal sinus tenderness present. No maxillary sinus tenderness.     Mouth/Throat:     Mouth: Mucous membranes are moist.      Pharynx: Posterior oropharyngeal erythema present. No pharyngeal swelling or oropharyngeal exudate.     Tonsils: No tonsillar exudate.  Eyes:     Extraocular Movements: Extraocular movements intact.     Conjunctiva/sclera: Conjunctivae normal.     Pupils: Pupils are equal, round, and reactive to light.  Neck:     Thyroid: No thyroid mass.  Cardiovascular:     Rate and Rhythm: Normal rate and regular rhythm.  Pulmonary:     Effort: Pulmonary effort is normal.     Breath sounds: Normal breath sounds.  Lymphadenopathy:     Cervical:     Right cervical: No superficial cervical adenopathy.    Left cervical: No superficial cervical adenopathy.  Neurological:     Mental Status: She is alert.      BP 130/74   Pulse 77   Temp 98.6 F (37 C) (Oral)   Ht _0  (1.6 m)   Wt 149 lb (67.6 kg)   LMP 12/01/2012 (Exact Date)   SpO2 98%   BMI 26.39 kg/m  Wt Readings from Last 3 Encounters:  08/05/22 149 lb (67.6 kg)  07/21/22 148 lb 6.4 oz (67.3 kg)  06/18/22 145 lb 2 oz (65.8 kg)     Health Maintenance Due  Topic Date Due   DTaP/Tdap/Td (1 - Tdap) Never done   MAMMOGRAM  Never done   Zoster Vaccines- Shingrix (1 of 2) Never done   COVID-19 Vaccine (5 - 2023-24 season) 04/03/2022    There are no preventive care reminders to display for this patient.  Lab Results  Component Value Date   TSH 1.260 03/03/2022   Lab Results  Component Value Date   WBC 9.0 06/08/2022   HGB 13.0 06/08/2022   HCT 39.8 06/08/2022   MCV 101.8 (H) 06/08/2022   PLT 182 06/08/2022   Lab Results  Component Value Date   NA 140 06/08/2022   K 3.8 06/08/2022   CO2 29 06/08/2022   GLUCOSE 87 06/08/2022   BUN 23 (H) 06/08/2022   CREATININE 0.91 06/08/2022   BILITOT 0.6 06/08/2022   ALKPHOS 66 06/08/2022   AST 19 06/08/2022   ALT 21 06/08/2022   PROT 7.5 06/08/2022   ALBUMIN 4.4 06/08/2022   CALCIUM 9.3 06/08/2022   ANIONGAP 8 06/08/2022   EGFR 89 10/23/2021   GFR 79.63 01/15/2021   Lab  Results  Component Value Date   CHOL 138 01/15/2021   Lab Results  Component Value Date   HDL 30.10 (L) 01/15/2021   Lab Results  Component Value Date   LDLCALC 91 01/15/2021   Lab Results  Component Value Date   TRIG 85.0 01/15/2021   Lab Results  Component Value Date   CHOLHDL 5 01/15/2021   Lab Results  Component Value Date   HGBA1C 5.2 10/23/2021      Assessment & Plan:   Problem List Items Addressed This Visit       Respiratory   Acute non-recurrent frontal sinusitis    Prescription given for augmentin 875/125 mg po bid for ten days as well as prednisone 40 mg once daily for five days. Pt to continue tylenol/ibuprofen prn sinus pain. Continue with humidifier prn and steam showers recommended as well. instructed If no symptom improvement in 48 hours please f/u       Relevant Medications   amoxicillin-clavulanate (AUGMENTIN) 875-125 MG tablet   predniSONE (DELTASONE) 20 MG tablet     Nervous and Auditory   Cervical radiculopathy    Advised to continue f/u with neurosurgeon, advised pt I am agreeable with his plan and feel it is very appropriate with her current situation. She does still request a second opinion so I have placed a referral for one. Also pt states she will f/u to coordinate PT with neurosurgery and will schedule injection as recommended by neurosurgeon. Recommended pt continue with gabapentin, lidocaine patches, and muscle relaxers prn along with heat therapy.       Relevant Medications   sertraline (ZOLOFT) 50 MG tablet   buPROPion (WELLBUTRIN XL) 150 MG 24 hr tablet   Other Relevant Orders   Ambulatory referral to Pain Clinic   Ambulatory referral to Neurosurgery   RESOLVED: Cervical radiculopathy, acute - Primary   Relevant Medications   sertraline (ZOLOFT) 50 MG tablet   buPROPion (WELLBUTRIN XL) 150 MG 24 hr tablet   predniSONE (DELTASONE) 20 MG tablet   Other Relevant Orders   Ambulatory referral to Pain Clinic   Ambulatory referral  to Neurosurgery     Other   Generalized anxiety disorder   Relevant Medications   sertraline (ZOLOFT) 50 MG tablet   buPROPion (WELLBUTRIN XL) 150 MG 24 hr tablet   Major depressive disorder   Relevant Medications   sertraline (ZOLOFT) 50 MG tablet   buPROPion (WELLBUTRIN XL) 150 MG 24 hr tablet   Patient's other noncompliance with medication regimen due to financial hardship   Chronic pain syndrome    Referral to pain management  Did advise pt that injections in cervical spine would likely be helpful however she also wants  to refer to pain management to see a second opinion as well for pain management options       Relevant Medications   sertraline (ZOLOFT) 50 MG tablet   buPROPion (WELLBUTRIN XL) 150 MG 24 hr tablet   predniSONE (DELTASONE) 20 MG tablet   Other Relevant Orders   Ambulatory referral to Pain Clinic   Low back pain of over 3 months duration   Relevant Medications   sertraline (ZOLOFT) 50 MG tablet   buPROPion (WELLBUTRIN XL) 150 MG 24 hr tablet   predniSONE (DELTASONE) 20 MG tablet   Other Relevant Orders   Ambulatory referral to Pain Clinic   Ambulatory referral to Neurosurgery    Meds ordered this encounter  Medications   sertraline (ZOLOFT) 50 MG tablet    Sig: Take 1 tablet (50 mg total) by mouth daily.    Dispense:  90 tablet    Refill:  2    Order Specific Question:   Supervising Provider    Answer:   BEDSOLE, AMY E [[id number]]   buPROPion (WELLBUTRIN XL) 150 MG 24 hr tablet    Sig: Take 1 tablet (150 mg total) by mouth daily.    Dispense:  90 tablet    Refill:  2    Order Specific Question:   Supervising Provider    Answer:   BEDSOLE, AMY E [[id number]]   amoxicillin-clavulanate (AUGMENTIN) 875-125 MG tablet    Sig: Take 1 tablet by mouth 2 (two) times daily.    Dispense:  20 tablet    Refill:  0    Order Specific Question:   Supervising Provider    Answer:   BEDSOLE, AMY E [[id number]]   predniSONE (DELTASONE) 20 MG tablet    Sig: Take two tablets once  daily for five days    Dispense:  10 tablet    Refill:  0    Order Specific Question:   Supervising Provider    Answer:   Diona Browner, AMY E [[id number]]    Follow-up: Return in about 3 months (around 11/04/2022) for f/u anxiety.    Eugenia Pancoast, FNP

## 2022-08-05 NOTE — Assessment & Plan Note (Signed)
Advised to continue f/u with neurosurgeon, advised pt I am agreeable with his plan and feel it is very appropriate with her current situation. She does still request a second opinion so I have placed a referral for one. Also pt states she will f/u to coordinate PT with neurosurgery and will schedule injection as recommended by neurosurgeon. Recommended pt continue with gabapentin, lidocaine patches, and muscle relaxers prn along with heat therapy.

## 2022-08-05 NOTE — Patient Instructions (Addendum)
  A referral was placed today for pain management.  Please let us know if you have not heard back within 2 weeks about the referral.  A referral was placed today for neurosurgery.  Please let us know if you have not heard back within 2 weeks about the referral.  Restart your medication (zoloft and wellbutrin)   Give Dr. Nelly Laurence office and inquire about physical therapy and the injections.   Take antibiotic as prescribed. Increase oral fluids. Pt to f/u if sx worsen and or fail to improve in 2-3 days.   Regards,   Eugenia Pancoast FNP-C

## 2022-08-05 NOTE — Assessment & Plan Note (Signed)
Prescription given for augmentin 875/125 mg po bid for ten days as well as prednisone 40 mg once daily for five days. Pt to continue tylenol/ibuprofen prn sinus pain. Continue with humidifier prn and steam showers recommended as well. instructed If no symptom improvement in 48 hours please f/u

## 2022-08-05 NOTE — Assessment & Plan Note (Deleted)
Referral ccm to see if this would be helpful

## 2022-08-05 NOTE — Assessment & Plan Note (Signed)
Referral to pain management  Did advise pt that injections in cervical spine would likely be helpful however she also wants to refer to pain management to see a second opinion as well for pain management options

## 2022-09-09 ENCOUNTER — Ambulatory Visit: Payer: No Typology Code available for payment source | Attending: Neurosurgery | Admitting: Physical Therapy

## 2022-09-14 ENCOUNTER — Ambulatory Visit: Payer: No Typology Code available for payment source | Admitting: Physical Therapy

## 2022-09-16 ENCOUNTER — Ambulatory Visit: Payer: No Typology Code available for payment source | Admitting: Physical Therapy

## 2022-09-21 ENCOUNTER — Encounter: Payer: No Typology Code available for payment source | Admitting: Physical Therapy

## 2022-09-23 ENCOUNTER — Ambulatory Visit: Payer: No Typology Code available for payment source | Admitting: Family Medicine

## 2022-09-23 ENCOUNTER — Encounter: Payer: No Typology Code available for payment source | Admitting: Physical Therapy

## 2022-09-24 ENCOUNTER — Ambulatory Visit: Payer: No Typology Code available for payment source | Admitting: Neurosurgery

## 2022-09-28 ENCOUNTER — Encounter: Payer: No Typology Code available for payment source | Admitting: Physical Therapy

## 2022-09-30 ENCOUNTER — Encounter: Payer: No Typology Code available for payment source | Admitting: Physical Therapy

## 2022-10-05 ENCOUNTER — Encounter: Payer: No Typology Code available for payment source | Admitting: Physical Therapy

## 2022-10-07 ENCOUNTER — Encounter: Payer: No Typology Code available for payment source | Admitting: Physical Therapy

## 2022-10-12 ENCOUNTER — Other Ambulatory Visit: Payer: Self-pay | Admitting: Orthopedic Surgery

## 2022-10-12 ENCOUNTER — Encounter: Payer: No Typology Code available for payment source | Admitting: Physical Therapy

## 2022-10-12 DIAGNOSIS — M4312 Spondylolisthesis, cervical region: Secondary | ICD-10-CM

## 2022-10-12 DIAGNOSIS — M542 Cervicalgia: Secondary | ICD-10-CM

## 2022-10-12 DIAGNOSIS — M5412 Radiculopathy, cervical region: Secondary | ICD-10-CM

## 2022-10-14 ENCOUNTER — Encounter: Payer: No Typology Code available for payment source | Admitting: Physical Therapy

## 2022-10-19 ENCOUNTER — Encounter: Payer: No Typology Code available for payment source | Admitting: Physical Therapy

## 2022-10-21 ENCOUNTER — Encounter: Payer: No Typology Code available for payment source | Admitting: Physical Therapy

## 2022-10-26 ENCOUNTER — Encounter: Payer: No Typology Code available for payment source | Admitting: Physical Therapy

## 2022-10-27 ENCOUNTER — Ambulatory Visit: Payer: Self-pay | Admitting: Neurosurgery

## 2022-10-28 ENCOUNTER — Encounter: Payer: No Typology Code available for payment source | Admitting: Physical Therapy

## 2022-11-04 ENCOUNTER — Ambulatory Visit: Payer: No Typology Code available for payment source | Admitting: Family

## 2022-11-11 ENCOUNTER — Ambulatory Visit: Payer: No Typology Code available for payment source | Admitting: Family

## 2022-11-11 ENCOUNTER — Encounter: Payer: Self-pay | Admitting: Family

## 2022-11-24 ENCOUNTER — Telehealth: Payer: No Typology Code available for payment source | Admitting: Family

## 2022-11-24 ENCOUNTER — Encounter: Payer: Self-pay | Admitting: Family

## 2022-11-24 VITALS — BP 132/82 | HR 96 | Temp 98.1°F | Ht 63.0 in | Wt 142.8 lb

## 2022-11-24 DIAGNOSIS — K529 Noninfective gastroenteritis and colitis, unspecified: Secondary | ICD-10-CM | POA: Diagnosis not present

## 2022-11-24 DIAGNOSIS — R6889 Other general symptoms and signs: Secondary | ICD-10-CM | POA: Diagnosis not present

## 2022-11-24 LAB — POCT INFLUENZA A/B
Influenza A, POC: NEGATIVE
Influenza B, POC: NEGATIVE

## 2022-11-24 LAB — POCT RAPID STREP A (OFFICE): Rapid Strep A Screen: NEGATIVE

## 2022-11-24 LAB — POC COVID19 BINAXNOW: SARS Coronavirus 2 Ag: NEGATIVE

## 2022-11-24 NOTE — Progress Notes (Unsigned)
Virtual Visit via Video note  I connected with Kathy Garcia on 11/25/22 is currently located at the office at [facility] family [facility].  The provider, Mort Sawyers, FNP is located in their home at time of visit.  I discussed the limitations, risks, security and privacy concerns of performing an evaluation and management service by video and the availability of in person appointments. I also discussed with the patient that there may be a patient responsible charge related to this service. The patient expressed understanding and agreed to proceed.  Subjective: PCP: Mort Sawyers, FNP  Chief Complaint  Patient presents with   Emesis   Fever    Emesis  Associated symptoms include a fever.  Fever  Associated symptoms include vomiting.    Yesterday morning around 2 am she woke up with fever and also started with throwing up. She did not stop all night and then into the day, stopped around 830 pm last night. She states when the fever broke the nausea stopped. She did take one zofran without relief. She also did take some pepto-bismal but couldn't keep this down. She did drink some Gatorade and had some crackers which was able to keep down. Didn't have diarrhea.   Woke up this am and feeling a little bit tired, so called out of work today.      ROS: Per HPI  Current Outpatient Medications:    acetaminophen (TYLENOL) 500 MG tablet, Take 500 mg by mouth every 6 (six) hours as needed., Disp: , Rfl:    albuterol (VENTOLIN HFA) 108 (90 Base) MCG/ACT inhaler, Inhale 2 puffs into the lungs every 6 (six) hours as needed for wheezing or shortness of breath., Disp: 8 g, Rfl: 2   buPROPion (WELLBUTRIN XL) 150 MG 24 hr tablet, Take 1 tablet (150 mg total) by mouth daily., Disp: 90 tablet, Rfl: 2   cholestyramine light (PREVALITE) 4 g packet, Take 1 packet (4 g total) by mouth daily., Disp: 90 packet, Rfl: 3   diclofenac Sodium (VOLTAREN) 1 % GEL, Apply 4 g topically 4 (four) times  daily., Disp: 500 g, Rfl: 11   gabapentin (NEURONTIN) 300 MG capsule, Take 1 capsule (300 mg total) by mouth 3 (three) times daily., Disp: 120 capsule, Rfl: 0   methocarbamol (ROBAXIN) 500 MG tablet, Take 1 tablet (500 mg total) by mouth every 6 (six) hours as needed for muscle spasms., Disp: 120 tablet, Rfl: 0   Multiple Vitamin (MULTIVITAMIN) tablet, Take 1 tablet by mouth daily., Disp: , Rfl:    naproxen (NAPROSYN) 500 MG tablet, TAKE 0.5 TABLETS (250 MG TOTAL) BY MOUTH 2 (TWO) TIMES DAILY WITH A MEAL., Disp: 30 tablet, Rfl: 2   omeprazole (PRILOSEC) 20 MG capsule, Take 1 capsule (20 mg total) by mouth 2 (two) times daily before a meal., Disp: 60 capsule, Rfl: 5   ondansetron (ZOFRAN-ODT) 4 MG disintegrating tablet, TAKE ONE TABLET EVERY EIGHT HOURS AS NEEDED FOR NAUSEA AND VOMITING, Disp: 20 tablet, Rfl: 0   predniSONE (DELTASONE) 20 MG tablet, Take two tablets once daily for five days, Disp: 10 tablet, Rfl: 0   sertraline (ZOLOFT) 50 MG tablet, Take 1 tablet (50 mg total) by mouth daily., Disp: 90 tablet, Rfl: 2  Observations/Objective: Physical Exam Constitutional:      General: She is not in acute distress.    Appearance: Normal appearance. She is not ill-appearing.  Cardiovascular:     Rate and Rhythm: Normal rate.  Pulmonary:     Effort: Pulmonary effort  is normal.  Neurological:     General: No focal deficit present.     Mental Status: She is alert and oriented to person, place, and time.  Psychiatric:        Mood and Affect: Mood normal.        Behavior: Behavior normal.        Thought Content: Thought content normal.     Assessment and Plan: Flu-like symptoms -     POC COVID-19 BinaxNow -     POCT Influenza A/B -     POCT rapid strep A  Gastroenteritis Assessment & Plan: Suspected GI bug  Resolved  Zofran prn if nausea comes back  Otherwise continue with oral fluids, and slow bland reintroduction of diet, d/w pt BRAT diet.       Follow Up  Instructions: Return in about 6 months (around 05/26/2023) for f/u CPE.   I discussed the assessment and treatment plan with the patient. The patient was provided an opportunity to ask questions and all were answered. The patient agreed with the plan and demonstrated an understanding of the instructions.   The patient was advised to call back or seek an in-person evaluation if the symptoms worsen or if the condition fails to improve as anticipated.  The above assessment and management plan was discussed with the patient. The patient verbalized understanding of and has agreed to the management plan. Patient is aware to call the clinic if symptoms persist or worsen. Patient is aware when to return to the clinic for a follow-up visit. Patient educated on when it is appropriate to go to the emergency department.    I provided 18 minutes of face-to-face time during this encounter.   Mort Sawyers, MSN, APRN, FNP-C [facility]

## 2022-11-25 DIAGNOSIS — K529 Noninfective gastroenteritis and colitis, unspecified: Secondary | ICD-10-CM | POA: Insufficient documentation

## 2022-11-25 NOTE — Assessment & Plan Note (Signed)
Suspected GI bug  Resolved  Zofran prn if nausea comes back  Otherwise continue with oral fluids, and slow bland reintroduction of diet, d/w pt BRAT diet.

## 2022-11-26 ENCOUNTER — Telehealth: Payer: Self-pay | Admitting: Family

## 2022-11-26 NOTE — Telephone Encounter (Signed)
Unable to leave a message. Need to verify where are we faxing the letter to, or husband can pick up from the front desk. Also, Did she start again with vomiting after she saw me ? Or is this still from the episode that occurred the first night.

## 2022-11-26 NOTE — Telephone Encounter (Signed)
Pt's husband called asking if note was ready? Pt's husband requested call once ready for pickup. Pt's husband stated the pt did not vomit today or yesterday.

## 2022-11-26 NOTE — Telephone Encounter (Signed)
Patient was seen on 11/24/2022,and diagnosed with a stomach bug.She was given a note to be out of work until today.She called in today stating that she is still not better,she is still very weak,fatigued and sore from all of the vomiting. 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 She would like to know if she can receive an extended note for work and if anything can be called in for her to help her sore stomach muscles. And she's out of zofran as well in case the nausea starts back again. Please advise.  Fax letter to job: [phone]

## 2022-11-26 NOTE — Telephone Encounter (Signed)
Patient returned call,and stated that the letter will be faxed to [facility]. She said that she did start to vomit after seeing Wyatt Mage  [phone] Attn:Jacob Tzarathe  She would like to know if something can be called in for her muscle spasms that she having from her vomiting. She has not vomited today

## 2022-11-26 NOTE — Telephone Encounter (Signed)
Letter has been faxed. Please see patients response.

## 2022-11-26 NOTE — Telephone Encounter (Signed)
Did she start again with vomiting after she saw me ? Or is this still from the episode that occurred the first night.   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

## 2022-11-27 NOTE — Telephone Encounter (Signed)
From the note and med list looks like she has zofran at home. Is she out?

## 2022-11-27 NOTE — Telephone Encounter (Signed)
Unable to reach patient. Unable to leave patient voicemail, mailbox full

## 2022-11-27 NOTE — Telephone Encounter (Signed)
Tried to call patient by telephone to relay message from Kathy Nine NP unable to leave a message  because mail box was full. Will have to contact patient again later.

## 2022-11-27 NOTE — Telephone Encounter (Signed)
If still ongoing symptoms pt to make f/u appt to be evaluated in office.

## 2022-11-27 NOTE — Telephone Encounter (Signed)
Patient wanted to know will anything be called in for her stomach?She said that there is jumping in her stomach right under her bra area. Which leads to her nausea. She is returning to work today and in the process of moving as well,and she didn't want to get sick.  [id number]ann Sheen, [place] - [address] Phone: [phone]  Fax: [phone]

## 2022-11-30 NOTE — Telephone Encounter (Signed)
Unable to leave patient voicemail, mailbox was full

## 2022-12-01 NOTE — Telephone Encounter (Signed)
Called patient mail box is full. This is the 4th call to patient. Will send letter to call office for evaluation if still having symptoms. Patient does not have mychart.

## 2022-12-22 NOTE — Progress Notes (Unsigned)
    Kathy Cockerell T. Kathy Nazir, MD, CAQ Sports Medicine [facility] [address] [place], [address]  Phone: [phone]  FAX: [phone]  Kathy Garcia - 57 y.o. female  MRN [id number]  Date of Birth: [birth year]  Date: 12/23/2022  PCP: Mort Sawyers, FNP  Referral: Mort Sawyers, FNP  No chief complaint on file.  Subjective:   Kathy Garcia is a 57 y.o. very pleasant female patient with There is no height or weight on file to calculate BMI. who presents with the following:  Patient presents with some ongoing severe back pain.  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  Looks as if she has also been referred to pain management in January 2024.  I do not see an appointment.    Review of Systems is noted in the HPI, as appropriate  Objective:   LMP 12/01/2012 (Exact Date)   GEN: No acute distress; alert,appropriate. PULM: Breathing comfortably in no respiratory distress PSYCH: Normally interactive.   Laboratory and Imaging Data:  Assessment and Plan:   ***

## 2022-12-23 ENCOUNTER — Ambulatory Visit
Admission: RE | Admit: 2022-12-23 | Discharge: 2022-12-23 | Disposition: A | Discharge status: Home / Self Care | Payer: No Typology Code available for payment source | Source: Ambulatory Visit | Attending: Family Medicine | Admitting: Family Medicine

## 2022-12-23 ENCOUNTER — Ambulatory Visit (INDEPENDENT_AMBULATORY_CARE_PROVIDER_SITE_OTHER): Payer: No Typology Code available for payment source | Admitting: Family Medicine

## 2022-12-23 ENCOUNTER — Encounter: Payer: Self-pay | Admitting: Family Medicine

## 2022-12-23 VITALS — BP 90/60 | HR 90 | Temp 98.3°F | Ht 63.0 in | Wt 151.0 lb

## 2022-12-23 DIAGNOSIS — M545 Low back pain, unspecified: Secondary | ICD-10-CM

## 2022-12-23 MED ORDER — PREDNISONE 20 MG PO TABS: ORAL_TABLET | ORAL | 0 refills | Status: DC

## 2022-12-23 MED ORDER — GABAPENTIN 300 MG PO CAPS: 300.0000 mg | ORAL_CAPSULE | Freq: Three times a day (TID) | ORAL | 3 refills | Status: AC

## 2022-12-23 MED ORDER — METHOCARBAMOL 500 MG PO TABS: 500.0000 mg | ORAL_TABLET | Freq: Four times a day (QID) | ORAL | 0 refills | Status: DC | PRN

## 2022-12-23 MED ORDER — DICLOFENAC SODIUM 1 % EX GEL: 4.0000 g | Freq: Four times a day (QID) | CUTANEOUS | 11 refills | Status: AC

## 2022-12-23 MED ORDER — ALBUTEROL SULFATE HFA 108 (90 BASE) MCG/ACT IN AERS: 2.0000 | INHALATION_SPRAY | Freq: Four times a day (QID) | RESPIRATORY_TRACT | 2 refills | Status: AC | PRN

## 2022-12-31 ENCOUNTER — Encounter: Payer: Self-pay | Admitting: Neurosurgery

## 2022-12-31 ENCOUNTER — Ambulatory Visit: Payer: No Typology Code available for payment source | Admitting: Neurosurgery

## 2022-12-31 VITALS — BP 130/64 | Ht 63.0 in | Wt 152.4 lb

## 2022-12-31 DIAGNOSIS — M5412 Radiculopathy, cervical region: Secondary | ICD-10-CM | POA: Diagnosis not present

## 2022-12-31 DIAGNOSIS — G894 Chronic pain syndrome: Secondary | ICD-10-CM

## 2022-12-31 DIAGNOSIS — M4712 Other spondylosis with myelopathy, cervical region: Secondary | ICD-10-CM | POA: Diagnosis not present

## 2022-12-31 DIAGNOSIS — M545 Low back pain, unspecified: Secondary | ICD-10-CM | POA: Diagnosis not present

## 2022-12-31 NOTE — Progress Notes (Signed)
Referring Physician:  No referring provider defined for this encounter.  Primary Physician:  Mort Sawyers, FNP  History of Present Illness: 12/31/2022 [name] is a 57 y.o with a history of smoking, cervical radiculopathy, generalized anxiety, major depressive disorder, and chronic pain who here today for further evaluation of her lumbar spine.  8 weeks of acute right-sided low back pain that starts in her tailbone and radiates into her right buttock without any particular inciting event.  She states it is worse with pushing laundry carts at work.  Initially started as a dull achy pain and is now worsened to a sharp stabbing pain.  She denies any pain that radiates down the length of her leg or similar symptoms on the left side.  She has been given a short course of prednisone which does provide some mild relief.  She continues to take Tylenol, gabapentin, Robaxin, and naproxen.  To her new lumbar complaints, she notes worsening neck pain with associated stiffness and is having some trouble with rotating her neck.  She also has pain that radiates into both arms that is worse on the left than the right and pain into her shoulder blades with associated numbness in her hands.  This is worse than lumbar complaints.  She also endorses a new onset of gait instability which started over the last month or so as well as trouble with dexterity. Unfortunately she does not participate in physical therapy for her neck as she was told by the therapist that it would not be covered by her insurance and by her insurance that she needed specific documentation.  This created some confusion for her and thus resulted in her not following up regarding physical therapy. Severity: 9  Precipitating: aggravated by Neck: turning her head; tailbone: bending, pushing Modifying factors: made better by ice packs Weakness: none Timing: constant Bowel/Bladder Dysfunction: none  Conservative measures:  Physical  therapy:  has not participated Multimodal medical therapy including regular antiinflammatories:  gabapentin, tylenol, methocarbamol, naproxen, prednisone Injections: has not received epidural steroid injections  Past Surgery: no previous spine surgery  Kathy Garcia has symptoms of questionable cervical myelopathy.  The symptoms are causing a significant impact on the patient's life.   Review of Systems:  A 10 point review of systems is negative, except for the pertinent positives and negatives detailed in the HPI.  Past Medical History: Past Medical History:  Diagnosis Date   Anemia    Anxiety    Arthritis    Asthma    Cervical dysplasia    Chronic abdominal pain    Chronic diarrhea    takes prevalite   Depression    Diverticulosis    GERD (gastroesophageal reflux disease)    Seasonal allergies    Wears glasses     Past Surgical History: Past Surgical History:  Procedure Laterality Date   CERVICAL CONIZATION W/BX N/A 12/16/2021   Procedure: LEEP  CONIZATION CERVIX WITH BIOPSY;  Surgeon: Reva Bores, MD;  Location: [facility];  Service: Gynecology;  Laterality: N/A;   CERVIX LESION DESTRUCTION  1995   CHOLECYSTECTOMY, LAPAROSCOPIC  01/04/2013   @[facility]    COLONOSCOPY WITH PROPOFOL N/A 03/27/2021   Procedure: COLONOSCOPY WITH PROPOFOL;  Surgeon: Wyline Mood, MD;  Location: [facility] ENDOSCOPY;  Service: Gastroenterology;  Laterality: N/A;   ESOPHAGOGASTRODUODENOSCOPY (EGD) WITH PROPOFOL N/A 04/01/2022   Procedure: ESOPHAGOGASTRODUODENOSCOPY (EGD) WITH PROPOFOL;  Surgeon: Wyline Mood, MD;  Location[facility] ENDOSCOPY;  Service: Gastroenterology;  Laterality: N/A;  PR ENDOMETRIAL BX CONJUNCT W/COLPOSCOPY  11/12/2021   TONSILLECTOMY  1972   TUBAL LIGATION Bilateral 1996    Allergies: Allergies as of 12/31/2022 - Review Complete 12/23/2022  Allergen Reaction Noted   Augmentin [amoxicillin-pot clavulanate] Other (See Comments) 11/24/2022   Hydrocodone Nausea  And Vomiting 01/03/2013    Medications: Outpatient Encounter Medications as of 12/31/2022  Medication Sig   acetaminophen (TYLENOL) 500 MG tablet Take 500 mg by mouth every 6 (six) hours as needed.   albuterol (VENTOLIN HFA) 108 (90 Base) MCG/ACT inhaler Inhale 2 puffs into the lungs every 6 (six) hours as needed for wheezing or shortness of breath.   buPROPion (WELLBUTRIN XL) 150 MG 24 hr tablet Take 1 tablet (150 mg total) by mouth daily.   cholestyramine light (PREVALITE) 4 g packet Take 1 packet (4 g total) by mouth daily.   diclofenac Sodium (VOLTAREN) 1 % GEL Apply 4 g topically 4 (four) times daily.   gabapentin (NEURONTIN) 300 MG capsule Take 1 capsule (300 mg total) by mouth 3 (three) times daily.   methocarbamol (ROBAXIN) 500 MG tablet Take 1 tablet (500 mg total) by mouth every 6 (six) hours as needed for muscle spasms.   Multiple Vitamin (MULTIVITAMIN) tablet Take 1 tablet by mouth daily.   naproxen (NAPROSYN) 500 MG tablet TAKE 0.5 TABLETS (250 MG TOTAL) BY MOUTH 2 (TWO) TIMES DAILY WITH A MEAL.   omeprazole (PRILOSEC) 20 MG capsule Take 1 capsule (20 mg total) by mouth 2 (two) times daily before a meal.   ondansetron (ZOFRAN-ODT) 4 MG disintegrating tablet TAKE ONE TABLET EVERY EIGHT HOURS AS NEEDED FOR NAUSEA AND VOMITING (Patient not taking: Reported on 12/23/2022)   predniSONE (DELTASONE) 20 MG tablet 2 tabs po daily for 5 days, then 1 tab po daily for 5 days   sertraline (ZOLOFT) 50 MG tablet Take 1 tablet (50 mg total) by mouth daily.   No facility-administered encounter medications on file as of 12/31/2022.    Social History: Social History   Tobacco Use   Smoking status: Every Day    Packs/day: 0.25    Years: 25.00    Additional pack years: 0.00    Total pack years: 6.25    Types: Cigarettes   Smokeless tobacco: Never   Tobacco comments:    1 ppd x 20 years, quit for 10 years, restarted 2018  Vaping Use   Vaping Use: Former  Substance Use Topics   Alcohol use:  Not Currently    Comment: none since 2018   Drug use: Never    Family Medical History: Family History  Problem Relation Age of Onset   Hypertension Mother    Alzheimer's disease Mother    Diverticulitis Mother    Cancer Father        unknown type   Colon cancer Maternal Aunt    Breast cancer Maternal Aunt    Heart failure Maternal Uncle    Stomach cancer Paternal Aunt    Brain cancer Paternal Aunt    Breast cancer Paternal Aunt     Physical Examination: Vitals:   12/31/22 1414  BP: 130/64     General: Patient is well developed, well nourished, calm, collected, and in no apparent distress. Attention to examination is appropriate.  Psychiatric: Patient is non-anxious.  Head:  Pupils equal, round, and reactive to light.  ENT:  Oral mucosa appears well hydrated.  Neck:   Supple.   Respiratory: Patient is breathing without any difficulty.  Extremities: No edema.  Vascular:  Palpable dorsal pedal pulses.  Skin:   On exposed skin, there are no abnormal skin lesions.  NEUROLOGICAL:     Awake, alert, oriented to person, place, and time.  Speech is clear and fluent. Fund of knowledge is appropriate.   Cranial Nerves: Pupils equal round and reactive to light.  Facial tone is symmetric.  Facial sensation is symmetric.  ROM of spine: Limited rotation of her cervical spine palpation of spine: non tender.    Strength: Side Biceps Triceps Deltoid Interossei Grip Wrist Ext. Wrist Flex.  R 4+ 5 5 4  4- 5 5  L 4+ 5 5 4- 4- 5 5   Side Iliopsoas Quads Hamstring PF DF EHL  R 5 5 5 5 5 5   L 5 5 5 5 5 5    Reflexes are 2+ and symmetric at the biceps, triceps, brachioradialis, patella and achilles.   Hoffman's is absent.  Clonus is not present.  Toes are down-going.  Bilateral upper and lower extremity sensation is intact to light touch.    Gait is normal.    Medical Decision Making  Imaging: 12/23/22 lumbar xrays FINDINGS: Scoliotic curvature of the thoracolumbar spine,  apex to the right. No other malalignment. No fractures. Multilevel degenerative changes with anterior osteophytes but no significant loss of disc height. Lower lumbar facet degenerative changes. No other bony abnormalities identified. Calcified atherosclerotic changes are identified in the abdominal aorta.   IMPRESSION: 1. Scoliotic curvature of the thoracolumbar spine, apex to the right. 2. Degenerative changes as above. 3. Calcified atherosclerotic changes in the abdominal aorta.     Electronically Signed   By: Gerome Sam III M.D.   On: 12/27/2022 10:06  I have personally reviewed the images and agree with the above interpretation.  Assessment and Plan: Ms. Sabater is a pleasant 57 y.o. female with longstanding history of cervical complaints have worsened over the last month or so with new symptoms concerning for cervical myelopathy.  Given this and her new upper extremity weakness, I would like to update a cervical MRI to evaluate this further.  In regards to her lumbar spine, she does had more than 6 weeks of persistent back and right buttock pain concerning for lumbar radiculopathy which has been refractory to medications.  I have ordered an MRI of this area as well for further evaluation.  I recommended that in the meantime she get set up with physical therapy and have placed a referral to [facility] in [place] for this.  We will discuss potential cervical injection should her MRI lack significant cervical stenosis and be largely stable.  In regard to her lumbar spine, if there is no explanation for her symptoms on this study we will consider an SI joint injection.  I will contact her with the results of the studies and further plan of care.  She is asked that we attempt to contact her at [phone] is currently having some phone trouble.  If we are not able to reach her at that number she has requested we contact her sister Baron Hamper [phone]).  I will likely have her set  up for a telephone visit so that she can anticipate my call.  Thank you for involving me in the care of this patient.   I spent a total of 45 minutes in both face-to-face and non-face-to-face activities for this visit on the date of this encounter review of previous records, review of imaging, discussion of symptoms regarding her neck and back, physical exam, discussion of treatment  options and differential diagnosis, and documentation.   [facility]. of Neuro[facility]

## 2023-01-09 ENCOUNTER — Ambulatory Visit
Admission: RE | Admit: 2023-01-09 | Discharge: 2023-01-09 | Disposition: A | Discharge status: Home / Self Care | Payer: PRIVATE HEALTH INSURANCE | Source: Ambulatory Visit | Attending: Neurosurgery | Admitting: Neurosurgery

## 2023-01-09 DIAGNOSIS — M5412 Radiculopathy, cervical region: Secondary | ICD-10-CM | POA: Insufficient documentation

## 2023-01-09 DIAGNOSIS — G894 Chronic pain syndrome: Secondary | ICD-10-CM | POA: Insufficient documentation

## 2023-01-09 DIAGNOSIS — M545 Low back pain, unspecified: Secondary | ICD-10-CM | POA: Insufficient documentation

## 2023-01-09 DIAGNOSIS — M4712 Other spondylosis with myelopathy, cervical region: Secondary | ICD-10-CM | POA: Diagnosis present

## 2023-01-26 ENCOUNTER — Ambulatory Visit (INDEPENDENT_AMBULATORY_CARE_PROVIDER_SITE_OTHER): Payer: PRIVATE HEALTH INSURANCE | Admitting: Neurosurgery

## 2023-01-26 DIAGNOSIS — M545 Low back pain, unspecified: Secondary | ICD-10-CM

## 2023-01-26 DIAGNOSIS — M5412 Radiculopathy, cervical region: Secondary | ICD-10-CM | POA: Diagnosis not present

## 2023-01-26 DIAGNOSIS — M5416 Radiculopathy, lumbar region: Secondary | ICD-10-CM | POA: Diagnosis not present

## 2023-01-26 NOTE — Progress Notes (Signed)
Neurosurgery Telephone (Audio-Only) Note  Requesting Provider     No referring provider defined for this encounter. T: N/A F:   Primary Care Provider Mort Sawyers, FNP [address] [address] T: [phone] F: [phone]  Telehealth visit was conducted with Virgie Dad, a 57 y.o. female via telephone.  History of Present Illness: Ms. Barse is a 2-year-old presenting today via telephone visit to review the results of her cervical and lumbar MRIs.  She denies any significant changes in the interim.  She has not started with physical therapy as recommended at her last visit.  LOV  12/31/2022 Ms. Gerica Koble is a 57 y.o with a history of smoking, cervical radiculopathy, generalized anxiety, major depressive disorder, and chronic pain who here today for further evaluation of her lumbar spine.  8 weeks of acute right-sided low back pain that starts in her tailbone and radiates into her right buttock without any particular inciting event.  She states it is worse with pushing laundry carts at work.  Initially started as a dull achy pain and is now worsened to a sharp stabbing pain.  She denies any pain that radiates down the length of her leg or similar symptoms on the left side.  She has been given a short course of prednisone which does provide some mild relief.  She continues to take Tylenol, gabapentin, Robaxin, and naproxen.  To her new lumbar complaints, she notes worsening neck pain with associated stiffness and is having some trouble with rotating her neck.  She also has pain that radiates into both arms that is worse on the left than the right and pain into her shoulder blades with associated numbness in her hands.  This is worse than lumbar complaints.  She also endorses a new onset of gait instability which started over the last month or so as well as trouble with dexterity. Unfortunately she does not participate in physical therapy for her neck as she  was told by the therapist that it would not be covered by her insurance and by her insurance that she needed specific documentation.  This created some confusion for her and thus resulted in her not following up regarding physical therapy. Severity: 9  Precipitating: aggravated by Neck: turning her head; tailbone: bending, pushing Modifying factors: made better by ice packs Weakness: none Timing: constant Bowel/Bladder Dysfunction: none   Conservative measures:  Physical therapy:  has not participated Multimodal medical therapy including regular antiinflammatories:  gabapentin, tylenol, methocarbamol, naproxen, prednisone Injections: has not received epidural steroid injections   Past Surgery: no previous spine surgery   ZAELA GRALEY has symptoms of questionable cervical myelopathy.   The symptoms are causing a significant impact on the patient's life.   General Review of Systems:  A ROS was performed including pertinent positive and negatives as documented.  All other systems are negative.   Prior to Admission medications   Medication Sig Start Date End Date Taking? Authorizing Provider  acetaminophen (TYLENOL) 500 MG tablet Take 500 mg by mouth every 6 (six) hours as needed.    [provider]  albuterol (VENTOLIN HFA) 108 (90 Base) MCG/ACT inhaler Inhale 2 puffs into the lungs every 6 (six) hours as needed for wheezing or shortness of breath. 12/23/22   Copland, Karleen Hampshire, MD  buPROPion (WELLBUTRIN XL) 150 MG 24 hr tablet Take 1 tablet (150 mg total) by mouth daily. 08/05/22   Mort Sawyers, FNP  cholestyramine light (PREVALITE) 4 g packet Take  1 packet (4 g total) by mouth daily. 03/03/22 03/31/23  Wyline Mood, MD  diclofenac Sodium (VOLTAREN) 1 % GEL Apply 4 g topically 4 (four) times daily. 12/23/22   Copland, Karleen Hampshire, MD  gabapentin (NEURONTIN) 300 MG capsule Take 1 capsule (300 mg total) by mouth 3 (three) times daily. 12/23/22   Copland, Karleen Hampshire, MD  methocarbamol  (ROBAXIN) 500 MG tablet Take 1 tablet (500 mg total) by mouth every 6 (six) hours as needed for muscle spasms. 12/23/22   Copland, Karleen Hampshire, MD  Multiple Vitamin (MULTIVITAMIN) tablet Take 1 tablet by mouth daily.    [provider]  omeprazole (PRILOSEC) 20 MG capsule Take 1 capsule (20 mg total) by mouth 2 (two) times daily before a meal. 05/19/22   Wyline Mood, MD  ondansetron (ZOFRAN-ODT) 4 MG disintegrating tablet TAKE ONE TABLET EVERY EIGHT HOURS AS NEEDED FOR NAUSEA AND VOMITING Patient not taking: Reported on 12/23/2022 06/13/21   Wyline Mood, MD  predniSONE (DELTASONE) 20 MG tablet 2 tabs po daily for 5 days, then 1 tab po daily for 5 days 12/23/22   Hannah Beat, MD  sertraline (ZOLOFT) 50 MG tablet Take 1 tablet (50 mg total) by mouth daily. 08/05/22   Mort Sawyers, FNP     DATA REVIEWED    Imaging Studies  MRI C spine 01/09/23 IMPRESSION: 1. 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 There is now severe left foraminal narrowing and mild to moderate right foraminal narrowing. There is flattening ventral cord at this level, worse to the left. 2. No change in spondylosis at C5-6 and C6-7 where there is flattening of the ventral cord and moderately severe to severe bilateral foraminal narrowing at C5-6 and severe bilateral foraminal narrowing at C6-7. 3. No change in reversal of cervical lordosis from C4-C7.     Electronically Signed   By: Drusilla Kanner M.D.   On: 01/20/2023 10:06  MRI L spine 01/09/23  IMPRESSION: 1. Right foraminal protrusion at L4-5 causes moderate to moderately severe right foraminal stenosis. There is also mild narrowing in the right subarticular recess at this level. 2. Mild narrowing in the left subarticular recess at L3-4.     Electronically Signed   By: Drusilla Kanner M.D.   On: 01/20/2023 10:09    IMPRESSION  Ms. Luebke is a 57 y.o. female who I performed a telephone encounter today for evaluation and  management of: Cervical and lumbar radiculopathy  PLAN  Taisley Mordan is a 58 year old presenting with symptoms concerning for cervical and lumbar radiculopathy.  Her cervical MRI does show significant foraminal stenosis bilaterally at C5-6 and C6-7 which likely explains her upper extremity symptoms and numbness into her hands.  I recommended further treatment with consideration of cervical ESI's for this and have placed a referral with to Dr. Yves Dill.  In regards to her lumbar spine, she has right-sided foraminal stenosis at L4-5 which likely explains her right buttock pain.  I have also recommended an injection for this.  I would also like her to complete conservative treatment with physical therapy and have placed a referral to [facility] for this.  I will see her back in 8 weeks after completion of physical therapy and her injections.  Should these not be completed at this time, we will need to reschedule her appointment for later date.  She was encouraged to call the office in the interim with any questions or concerns.  She expressed understanding and was in agreement with this plan.  Orders Placed  This Encounter  Procedures   Ambulatory referral to Physical Therapy   Ambulatory referral to Physicial Medicine Rehab    DISPOSITION  Follow up: In person appointment in 2 months  Dmitry Macomber Anders Simmonds, PA   TELEPHONE DOCUMENTATION   This visit was performed via telephone.  Patient location: home Provider location: office  I spent a total of 15 minutes non-face-to-face activities for this visit on the date of this encounter including review of current clinical condition and response to treatment.  The patient is aware of and accepts the limits of this telehealth visit.

## 2023-01-31 ENCOUNTER — Other Ambulatory Visit: Payer: Self-pay

## 2023-01-31 ENCOUNTER — Emergency Department
Admission: EM | Admit: 2023-01-31 | Discharge: 2023-01-31 | Disposition: A | Discharge status: Home / Self Care | Payer: PRIVATE HEALTH INSURANCE | Attending: Emergency Medicine | Admitting: Emergency Medicine

## 2023-01-31 DIAGNOSIS — I10 Essential (primary) hypertension: Secondary | ICD-10-CM | POA: Diagnosis not present

## 2023-01-31 DIAGNOSIS — J449 Chronic obstructive pulmonary disease, unspecified: Secondary | ICD-10-CM | POA: Insufficient documentation

## 2023-01-31 DIAGNOSIS — R197 Diarrhea, unspecified: Secondary | ICD-10-CM | POA: Diagnosis not present

## 2023-01-31 DIAGNOSIS — R112 Nausea with vomiting, unspecified: Secondary | ICD-10-CM | POA: Diagnosis present

## 2023-01-31 LAB — COMPREHENSIVE METABOLIC PANEL
ALT: 20 U/L (ref 0–44)
AST: 23 U/L (ref 15–41)
Albumin: 5 g/dL (ref 3.5–5.0)
Alkaline Phosphatase: 78 U/L (ref 38–126)
Anion gap: 13 (ref 5–15)
BUN: 17 mg/dL (ref 6–20)
CO2: 22 mmol/L (ref 22–32)
Calcium: 9.8 mg/dL (ref 8.9–10.3)
Chloride: 102 mmol/L (ref 98–111)
Creatinine, Ser: 0.65 mg/dL (ref 0.44–1.00)
GFR, Estimated: >60 mL/min (ref >60)
Glucose, Bld: 148 mg/dL — ABNORMAL HIGH (ref 70–99)
Potassium: 3.6 mmol/L (ref 3.5–5.1)
Sodium: 137 mmol/L (ref 135–145)
Total Bilirubin: 0.7 mg/dL (ref 0.3–1.2)
Total Protein: 8.1 g/dL (ref 6.5–8.1)

## 2023-01-31 LAB — URINALYSIS, ROUTINE W REFLEX MICROSCOPIC
Bacteria, UA: NONE SEEN
Bilirubin Urine: NEGATIVE
Glucose, UA: NEGATIVE mg/dL
Hgb urine dipstick: NEGATIVE
Ketones, ur: 20 mg/dL — AB
Leukocytes,Ua: NEGATIVE
Nitrite: NEGATIVE
Protein, ur: 100 mg/dL — AB
Specific Gravity, Urine: 1.011 (ref 1.005–1.030)
pH: 7 (ref 5.0–8.0)

## 2023-01-31 LAB — CBC
HCT: 44.7 % (ref 36.0–46.0)
Hemoglobin: 15.1 g/dL — ABNORMAL HIGH (ref 12.0–15.0)
MCH: 32.9 pg (ref 26.0–34.0)
MCHC: 33.8 g/dL (ref 30.0–36.0)
MCV: 97.4 fL (ref 80.0–100.0)
Platelets: 289 10*3/uL (ref 150–400)
RBC: 4.59 MIL/uL (ref 3.87–5.11)
RDW: 12.5 % (ref 11.5–15.5)
WBC: 14.2 10*3/uL — ABNORMAL HIGH (ref 4.0–10.5)
nRBC: 0 % (ref 0.0–0.2)

## 2023-01-31 LAB — LIPASE, BLOOD: Lipase: 23 U/L (ref 11–51)

## 2023-01-31 MED ORDER — ONDANSETRON 4 MG PO TBDP: 4.0000 mg | ORAL_TABLET | Freq: Three times a day (TID) | ORAL | 0 refills | Status: AC | PRN

## 2023-01-31 MED ORDER — ONDANSETRON 4 MG PO TBDP
4.0000 mg | ORAL_TABLET | Freq: Once | ORAL | Status: AC
  Administered 2023-01-31: 4 mg via ORAL
  Filled 2023-01-31: qty 1

## 2023-01-31 MED ORDER — LACTATED RINGERS IV BOLUS
1000.0000 mL | Freq: Once | INTRAVENOUS | Status: AC
  Administered 2023-01-31: 1000 mL via INTRAVENOUS

## 2023-01-31 MED ORDER — ONDANSETRON HCL 4 MG/2ML IJ SOLN
4.0000 mg | Freq: Once | INTRAMUSCULAR | Status: AC
  Administered 2023-01-31: 4 mg via INTRAVENOUS
  Filled 2023-01-31: qty 2

## 2023-01-31 NOTE — ED Triage Notes (Signed)
Pt to ed from home via POV for nausea and vomiting. Pt is caox4, in no acute distress in triage.

## 2023-01-31 NOTE — ED Provider Notes (Signed)
[facility] Provider Note    Event Date/Time   First MD Initiated Contact with Patient 01/31/23 1815     (approximate)   History   Chief Complaint Emesis   HPI  Kathy Garcia is a 57 y.o. female with past medical history of hypertension, migraines, COPD, anxiety, and chronic pain syndrome who presents to the ED complaining of nausea and vomiting.  Patient reports that she has been dealing with persistent nausea and numerous episodes of vomiting since 3:00 this morning.  She has been unable to keep down either liquids or solids, does describe some associated diarrhea.  She has not noticed any blood in her emesis or stool, denies associated abdominal pain.  She has not had any fevers, flank pain, or dysuria.  She is not aware of any sick contacts.     Physical Exam   Triage Vital Signs: ED Triage Vitals  Enc Vitals Group     BP 01/31/23 1755 (!) 179/99     Pulse Rate 01/31/23 1755 85     Resp 01/31/23 1755 16     Temp 01/31/23 1755 98 F (36.7 C)     Temp Source 01/31/23 1755 Oral     SpO2 01/31/23 1755 100 %     Weight 01/31/23 1754 150 lb (68 kg)     Height 01/31/23 1754 5\' 3"  (1.6 m)     Head Circumference --      Peak Flow --      Pain Score 01/31/23 1754 0     Pain Loc --      Pain Edu? --      Excl. in GC? --     Most recent vital signs: Vitals:   01/31/23 2030 01/31/23 2100  BP: (!) 171/94 (!) 182/92  Pulse: 73 85  Resp: 16 16  Temp:    SpO2: 97% 99%    Constitutional: Alert and oriented. Eyes: Conjunctivae are normal. Head: Atraumatic. Nose: No congestion/rhinnorhea. Mouth/Throat: Mucous membranes are moist.  Cardiovascular: Normal rate, regular rhythm. Grossly normal heart sounds.  2+ radial pulses bilaterally. Respiratory: Normal respiratory effort.  No retractions. Lungs CTAB. Gastrointestinal: Soft and nontender. No distention. Musculoskeletal: No lower extremity tenderness nor edema.  Neurologic:  Normal speech and  language. No gross focal neurologic deficits are appreciated.    ED Results / Procedures / Treatments   Labs (all labs ordered are listed, but only abnormal results are displayed) Labs Reviewed  COMPREHENSIVE METABOLIC PANEL - Abnormal; Notable for the following components:      Result Value   Glucose, Bld 148 (*)    All other components within normal limits  CBC - Abnormal; Notable for the following components:   WBC 14.2 (*)    Hemoglobin 15.1 (*)    All other components within normal limits  URINALYSIS, ROUTINE W REFLEX MICROSCOPIC - Abnormal; Notable for the following components:   Color, Urine STRAW (*)    APPearance CLEAR (*)    Ketones, ur 20 (*)    Protein, ur 100 (*)    All other components within normal limits  LIPASE, BLOOD    PROCEDURES:  Critical Care performed: No  Procedures   MEDICATIONS ORDERED IN ED: Medications  ondansetron (ZOFRAN) injection 4 mg (4 mg Intravenous Given 01/31/23 1843)  lactated ringers bolus 1,000 mL (0 mLs Intravenous Stopped 01/31/23 1944)  ondansetron (ZOFRAN-ODT) disintegrating tablet 4 mg (4 mg Oral Given 01/31/23 2053)     IMPRESSION / MDM / ASSESSMENT AND  PLAN / ED COURSE  I reviewed the triage vital signs and the nursing notes.                              57 y.o. female with past medical history of hypertension, migraines, COPD, anxiety, chronic pain syndrome who presents to the ED complaining of persistent nausea and vomiting with diarrhea since 3:00 this morning.  Patient's presentation is most consistent with acute presentation with potential threat to life or bodily function.  Differential diagnosis includes, but is not limited to, gastroenteritis, gastritis, pancreatitis, hepatitis, cholecystitis, appendicitis, diverticulitis, dehydration, electrolyte abnormality, AKI.  Patient nontoxic-appearing and in no acute distress, vital signs are unremarkable.  Her abdomen is soft with no focal tenderness, symptoms seem most  consistent with a viral gastroenteritis.  Labs remarkable for leukocytosis with some elevation in hemoglobin that could be due to hemoconcentration.  No significant electrolyte abnormality, AKI, or LFT abnormality.  Urinalysis pending at this time, we will treat symptomatically with IV Zofran and hydrate with IV fluids.  Urinalysis shows no signs of infection, patient reports feeling better following 2 doses of Zofran and IV fluid hydration.  She is tolerating oral intake without difficulty and is appropriate for discharge home with PCP follow-up.  She was counseled to return to the ED for new or worsening symptoms, patient agrees with plan.      FINAL CLINICAL IMPRESSION(S) / ED DIAGNOSES   Final diagnoses:  Nausea vomiting and diarrhea     Rx / DC Orders   ED Discharge Orders          Ordered    ondansetron (ZOFRAN-ODT) 4 MG disintegrating tablet  Every 8 hours PRN        01/31/23 2155             Note:  This document was prepared using Dragon voice recognition software and may include unintentional dictation errors.   Chesley Noon, MD 01/31/23 2157

## 2023-01-31 NOTE — ED Notes (Signed)
Patient aware of need for urine, unable to provide sample at this time.

## 2023-01-31 NOTE — ED Notes (Signed)
ED Provider at bedside. 

## 2023-02-02 ENCOUNTER — Ambulatory Visit (INDEPENDENT_AMBULATORY_CARE_PROVIDER_SITE_OTHER): Payer: No Typology Code available for payment source | Admitting: Neurosurgery

## 2023-02-02 ENCOUNTER — Encounter: Payer: Self-pay | Admitting: Neurosurgery

## 2023-02-02 VITALS — BP 127/82 | HR 81 | Temp 98.5°F | Wt 141.0 lb

## 2023-02-02 DIAGNOSIS — M4802 Spinal stenosis, cervical region: Secondary | ICD-10-CM

## 2023-02-02 DIAGNOSIS — M545 Low back pain, unspecified: Secondary | ICD-10-CM

## 2023-02-02 DIAGNOSIS — M5412 Radiculopathy, cervical region: Secondary | ICD-10-CM

## 2023-02-02 DIAGNOSIS — M5416 Radiculopathy, lumbar region: Secondary | ICD-10-CM

## 2023-02-02 DIAGNOSIS — M48061 Spinal stenosis, lumbar region without neurogenic claudication: Secondary | ICD-10-CM

## 2023-02-02 NOTE — Progress Notes (Signed)
Follow-up note: Referring Physician:  Mort Sawyers, FNP [address] [place],  [place] [address]  Primary Physician:  Kathy Sawyers, FNP  Chief Complaint:  f/u to review MRIs at patients request  History of Present Illness: Kathy Garcia is a 57 y.o. female who presents today for review of MRIs. She denies any significant changes to her symptoms  LOV 12/21/22 Kathy Garcia is a 57 y.o with a history of smoking, cervical radiculopathy, generalized anxiety, major depressive disorder, and chronic pain who here today for further evaluation of her lumbar spine.  8 weeks of acute right-sided low back pain that starts in her tailbone and radiates into her right buttock without any particular inciting event.  She states it is worse with pushing laundry carts at work.  Initially started as a dull achy pain and is now worsened to a sharp stabbing pain.  She denies any pain that radiates down the length of her leg or similar symptoms on the left side.  She has been given a short course of prednisone which does provide some mild relief.  She continues to take Tylenol, gabapentin, Robaxin, and naproxen.  To her new lumbar complaints, she notes worsening neck pain with associated stiffness and is having some trouble with rotating her neck.  She also has pain that radiates into both arms that is worse on the left than the right and pain into her shoulder blades with associated numbness in her hands.  This is worse than lumbar complaints.  She also endorses a new onset of gait instability which started over the last month or so as well as trouble with dexterity. Unfortunately she does not participate in physical therapy for her neck as she was told by the therapist that it would not be covered by her insurance and by her insurance that she needed specific documentation.  This created some confusion for her and thus resulted in her not following up regarding physical therapy. Severity: 9   Precipitating: aggravated by Neck: turning her head; tailbone: bending, pushing Modifying factors: made better by ice packs Weakness: none Timing: constant Bowel/Bladder Dysfunction: none   Conservative measures:  Physical therapy:  has not participated Multimodal medical therapy including regular antiinflammatories:  gabapentin, tylenol, methocarbamol, naproxen, prednisone Injections: has not received epidural steroid injections   Past Surgery: no previous spine surgery   Kathy Garcia has symptoms of questionable cervical myelopathy.   The symptoms are causing a significant impact on the patient's life.  Review of Systems:  A 10 point review of systems is negative, and the pertinent positives and negatives detailed in the HPI.  Past Medical History: Past Medical History:  Diagnosis Date   Anemia    Anxiety    Arthritis    Asthma    Cervical dysplasia    Chronic abdominal pain    Chronic diarrhea    takes prevalite   Depression    Diverticulosis    GERD (gastroesophageal reflux disease)    Seasonal allergies    Wears glasses     Past Surgical History: Past Surgical History:  Procedure Laterality Date   CERVICAL CONIZATION W/BX N/A 12/16/2021   Procedure: LEEP  CONIZATION CERVIX WITH BIOPSY;  Surgeon: Kathy Bores, MD;  Location: [facility];  Service: Gynecology;  Laterality: N/A;   CERVIX LESION DESTRUCTION  1995   CHOLECYSTECTOMY, LAPAROSCOPIC  01/04/2013   @[facility]    COLONOSCOPY WITH PROPOFOL N/A 03/27/2021   Procedure: COLONOSCOPY WITH PROPOFOL;  Surgeon: Kathy Mood, MD;  Location: [facility] ENDOSCOPY;  Service: Gastroenterology;  Laterality: N/A;   ESOPHAGOGASTRODUODENOSCOPY (EGD) WITH PROPOFOL N/A 04/01/2022   Procedure: ESOPHAGOGASTRODUODENOSCOPY (EGD) WITH PROPOFOL;  Surgeon: Kathy Mood, MD;  Location: [facility] ENDOSCOPY;  Service: Gastroenterology;  Laterality: N/A;   PR ENDOMETRIAL BX CONJUNCT W/COLPOSCOPY  11/12/2021   TONSILLECTOMY  1972    TUBAL LIGATION Bilateral 1996    Allergies: Allergies as of 02/02/2023 - Review Complete 01/31/2023  Allergen Reaction Noted   Augmentin [amoxicillin-pot clavulanate] Other (See Comments) 11/24/2022   Hydrocodone Nausea And Vomiting 01/03/2013    Medications: Outpatient Encounter Medications as of 02/02/2023  Medication Sig   acetaminophen (TYLENOL) 500 MG tablet Take 500 mg by mouth every 6 (six) hours as needed.   albuterol (VENTOLIN HFA) 108 (90 Base) MCG/ACT inhaler Inhale 2 puffs into the lungs every 6 (six) hours as needed for wheezing or shortness of breath.   buPROPion (WELLBUTRIN XL) 150 MG 24 hr tablet Take 1 tablet (150 mg total) by mouth daily.   cholestyramine light (PREVALITE) 4 g packet Take 1 packet (4 g total) by mouth daily.   diclofenac Sodium (VOLTAREN) 1 % GEL Apply 4 g topically 4 (four) times daily.   gabapentin (NEURONTIN) 300 MG capsule Take 1 capsule (300 mg total) by mouth 3 (three) times daily.   methocarbamol (ROBAXIN) 500 MG tablet Take 1 tablet (500 mg total) by mouth every 6 (six) hours as needed for muscle spasms.   Multiple Vitamin (MULTIVITAMIN) tablet Take 1 tablet by mouth daily.   omeprazole (PRILOSEC) 20 MG capsule Take 1 capsule (20 mg total) by mouth 2 (two) times daily before a meal.   ondansetron (ZOFRAN-ODT) 4 MG disintegrating tablet Take 1 tablet (4 mg total) by mouth every 8 (eight) hours as needed for nausea or vomiting.   predniSONE (DELTASONE) 20 MG tablet 2 tabs po daily for 5 days, then 1 tab po daily for 5 days   sertraline (ZOLOFT) 50 MG tablet Take 1 tablet (50 mg total) by mouth daily.   No facility-administered encounter medications on file as of 02/02/2023.    Social History: Social History   Tobacco Use   Smoking status: Every Day    Packs/day: 0.25    Years: 25.00    Additional pack years: 0.00    Total pack years: 6.25    Types: Cigarettes   Smokeless tobacco: Never   Tobacco comments:    1 ppd x 20 years, quit for 10  years, restarted 2018  Vaping Use   Vaping Use: Former  Substance Use Topics   Alcohol use: Not Currently    Comment: none since 2018   Drug use: Never    Family Medical History: Family History  Problem Relation Age of Onset   Hypertension Mother    Alzheimer's disease Mother    Diverticulitis Mother    Cancer Father        unknown type   Colon cancer Maternal Aunt    Breast cancer Maternal Aunt    Heart failure Maternal Uncle    Stomach cancer Paternal Aunt    Brain cancer Paternal Aunt    Breast cancer Paternal Aunt     Exam: Today's Vitals   02/02/23 1143  BP: 127/82  Pulse: 81  Temp: 98.5 F (36.9 C)  TempSrc: Oral  Weight: 64 kg   Body mass index is 24.98 kg/m.  MAEW  Imaging: 01/09/23 MRI L spine IMPRESSION: 1. Right foraminal protrusion at L4-5 causes moderate to  moderately severe right foraminal stenosis. There is also mild narrowing in the right subarticular recess at this level. 2. Mild narrowing in the left subarticular recess at L3-4.     Electronically Signed   By: Drusilla Kanner M.D.   On: 01/20/2023 10:09  01/09/23 MRI C spine IMPRESSION: 1. 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 There is now severe left foraminal narrowing and mild to moderate right foraminal narrowing. There is flattening ventral cord at this level, worse to the left. 2. No change in spondylosis at C5-6 and C6-7 where there is flattening of the ventral cord and moderately severe to severe bilateral foraminal narrowing at C5-6 and severe bilateral foraminal narrowing at C6-7. 3. No change in reversal of cervical lordosis from C4-C7.     Electronically Signed   By: Drusilla Kanner M.D.   On: 01/20/2023 10:06    I have personally reviewed the images and agree with the above interpretation.  Assessment and Plan: Ms. Nahar is a pleasant 57 y.o. female with cervical and lumbar complaints.  We reviewed her MRI results in detail and discussed  further plan of care.  I am still recommending that she undergo ESI's and physical therapy.  I will follow-up with her after completion of these to discuss next steps.  She expressed understanding and was in agreement with this plan.  I spent a total of 20 minutes in both face-to-face and non-face-to-face activities for this visit on the date of this encounter including review of symptoms, review of MRI, and documentation  Manning Charity PA-C Neurosurgery

## 2023-02-03 ENCOUNTER — Encounter: Payer: Self-pay | Admitting: Family

## 2023-02-03 ENCOUNTER — Ambulatory Visit (INDEPENDENT_AMBULATORY_CARE_PROVIDER_SITE_OTHER): Payer: PRIVATE HEALTH INSURANCE | Admitting: Family

## 2023-02-03 ENCOUNTER — Other Ambulatory Visit: Payer: Self-pay | Admitting: Family Medicine

## 2023-02-03 VITALS — BP 128/76 | HR 81 | Temp 97.7°F | Ht 63.0 in | Wt 142.8 lb

## 2023-02-03 DIAGNOSIS — I10 Essential (primary) hypertension: Secondary | ICD-10-CM | POA: Diagnosis not present

## 2023-02-03 DIAGNOSIS — R739 Hyperglycemia, unspecified: Secondary | ICD-10-CM | POA: Diagnosis not present

## 2023-02-03 DIAGNOSIS — F411 Generalized anxiety disorder: Secondary | ICD-10-CM

## 2023-02-03 DIAGNOSIS — Z720 Tobacco use: Secondary | ICD-10-CM

## 2023-02-03 DIAGNOSIS — K529 Noninfective gastroenteritis and colitis, unspecified: Secondary | ICD-10-CM

## 2023-02-03 DIAGNOSIS — D72829 Elevated white blood cell count, unspecified: Secondary | ICD-10-CM

## 2023-02-03 MED ORDER — SERTRALINE HCL 100 MG PO TABS
100.0000 mg | ORAL_TABLET | Freq: Every day | ORAL | 3 refills | Status: AC
Start: 2023-02-03 — End: ?

## 2023-02-03 NOTE — Assessment & Plan Note (Signed)
Smoking cessation instruction/counseling given:  counseled patient on the dangers of tobacco use, advised patient to stop smoking, and reviewed strategies to maximize success 

## 2023-02-03 NOTE — Assessment & Plan Note (Signed)
Check A1c pending results.  Pt advised of the following: Work on a diabetic diet, try to incorporate exercise at least 20-30 a day for 3 days a week or more.

## 2023-02-03 NOTE — Patient Instructions (Signed)
Start stool softener.  Eat as tolerated, bland diet.    Regards,   Mort Sawyers FNP-C

## 2023-02-03 NOTE — Addendum Note (Signed)
Addended by: Alvina Chou on: 02/03/2023 12:25 PM   Modules accepted: Orders

## 2023-02-03 NOTE — Progress Notes (Signed)
Established Patient Hospital follow up Visit Subjective:   Patient ID: Kathy Garcia, female    DOB: 04/25/1966  Age: 57 y.o. MRN: [id number]  CC:  Chief Complaint  Patient presents with   Asthma   fatigue         HPI  Kathy Garcia is a 57 y.o. female presenting on 02/03/2023 for  hospital follow up.    Went to [employer] on 6/30 with c/o nausea vomiting  Given zofran and sent home.  At the office she had elevated wbc at 14.2  Sx started six days ago  Lab Results  Component Value Date   WBC 14.2 (H) 01/31/2023   HGB 15.1 (H) 01/31/2023   HCT 44.7 01/31/2023   MCV 97.4 01/31/2023   PLT 289 01/31/2023   Increased anxiety over health.  Often causing problems at work as unable to focus due to pain.   Last metabolic panel Lab Results  Component Value Date   GLUCOSE 148 (H) 01/31/2023   NA 137 01/31/2023   K 3.6 01/31/2023   CL 102 01/31/2023   CO2 22 01/31/2023   BUN 17 01/31/2023   CREATININE 0.65 01/31/2023   GFRNONAA >60 01/31/2023   CALCIUM 9.8 01/31/2023   PROT 8.1 01/31/2023   ALBUMIN 5.0 01/31/2023   LABGLOB 2.2 10/23/2021   AGRATIO 2.1 10/23/2021   BILITOT 0.7 01/31/2023   ALKPHOS 78 01/31/2023   AST 23 01/31/2023   ALT 20 01/31/2023   ANIONGAP 13 01/31/2023    Acute concerns:  Yesterday was able to keep some food down and her first day back to eating.  Has been drinking water and powerade. Light cramping at times. Diarrhea at the beginning but not since.     Allergies  Allergen Reactions   Augmentin [Amoxicillin-Pot Clavulanate] Other (See Comments)    Diarrhea GI upset    Hydrocodone Nausea And Vomiting    N/v when doesn't eat   ----------------   Social history:  Relevant past medical, surgical, family and social history reviewed and updated as indicated. Interim medical history since our last visit reviewed.  Allergies and medications reviewed and updated.  DATA REVIEWED: CHART IN EPIC    ROS: Negative unless specifically  indicated above in HPI.    Current Outpatient Medications:    acetaminophen (TYLENOL) 500 MG tablet, Take 500 mg by mouth every 6 (six) hours as needed., Disp: , Rfl:    albuterol (VENTOLIN HFA) 108 (90 Base) MCG/ACT inhaler, Inhale 2 puffs into the lungs every 6 (six) hours as needed for wheezing or shortness of breath., Disp: 8 g, Rfl: 2   buPROPion (WELLBUTRIN XL) 150 MG 24 hr tablet, Take 1 tablet (150 mg total) by mouth daily., Disp: 90 tablet, Rfl: 2   cholestyramine light (PREVALITE) 4 g packet, Take 1 packet (4 g total) by mouth daily., Disp: 90 packet, Rfl: 3   diclofenac Sodium (VOLTAREN) 1 % GEL, Apply 4 g topically 4 (four) times daily., Disp: 500 g, Rfl: 11   gabapentin (NEURONTIN) 300 MG capsule, Take 1 capsule (300 mg total) by mouth 3 (three) times daily., Disp: 90 capsule, Rfl: 3   methocarbamol (ROBAXIN) 500 MG tablet, Take 1 tablet (500 mg total) by mouth every 6 (six) hours as needed for muscle spasms., Disp: 120 tablet, Rfl: 0   Multiple Vitamin (MULTIVITAMIN) tablet, Take 1 tablet by mouth daily., Disp: , Rfl:    naproxen (NAPROSYN) 250 MG tablet, Take 250 mg by mouth 2 (two) times  daily with a meal., Disp: , Rfl:    omeprazole (PRILOSEC) 20 MG capsule, Take 1 capsule (20 mg total) by mouth 2 (two) times daily before a meal., Disp: 60 capsule, Rfl: 5   ondansetron (ZOFRAN-ODT) 4 MG disintegrating tablet, Take 1 tablet (4 mg total) by mouth every 8 (eight) hours as needed for nausea or vomiting., Disp: 12 tablet, Rfl: 0   sertraline (ZOLOFT) 100 MG tablet, Take 1 tablet (100 mg total) by mouth daily., Disp: 90 tablet, Rfl: 3      Objective:    BP 128/76   Pulse 81   Temp 97.7 F (36.5 C)   Ht 5\' 3"  (1.6 m)   Wt 142 lb 12.8 oz (64.8 kg)   LMP 12/01/2012 (Exact Date)   SpO2 94%   BMI 25.30 kg/m   Wt Readings from Last 3 Encounters:  02/03/23 142 lb 12.8 oz (64.8 kg)  02/02/23 141 lb (64 kg)  01/31/23 150 lb (68 kg)    Physical Exam Constitutional:       General: She is not in acute distress.    Appearance: Normal appearance. She is normal weight. She is not ill-appearing, toxic-appearing or diaphoretic.  HENT:     Head: Normocephalic.  Cardiovascular:     Rate and Rhythm: Normal rate and regular rhythm.  Pulmonary:     Effort: Pulmonary effort is normal.     Breath sounds: Normal breath sounds.  Musculoskeletal:        General: Normal range of motion.  Neurological:     General: No focal deficit present.     Mental Status: She is alert and oriented to person, place, and time. Mental status is at baseline.  Psychiatric:        Mood and Affect: Mood normal.        Behavior: Behavior normal.        Thought Content: Thought content normal.        Judgment: Judgment normal.         Assessment & Plan:  Hyperglycemia Assessment & Plan: Check A1c pending results.  Pt advised of the following: Work on a diabetic diet, try to incorporate exercise at least 20-30 a day for 3 days a week or more.    Orders: -     Hemoglobin A1c  Leukocytosis, unspecified type Assessment & Plan: Suspected was from GI virus will repeat today pending results.   Orders: -     CBC with Differential/Platelet  Tobacco abuse Assessment & Plan: Smoking cessation instruction/counseling given:  counseled patient on the dangers of tobacco use, advised patient to stop smoking, and reviewed strategies to maximize success    Essential hypertension Assessment & Plan: stable   Gastroenteritis Assessment & Plan: Seems to be improving Continue with bland diet as tolerating   Generalized anxiety disorder Assessment & Plan: Increase sertraline to 100 mg once daily.    Orders: -     Sertraline HCl; Take 1 tablet (100 mg total) by mouth daily.  Dispense: 90 tablet; Refill: 3     Return in about 6 months (around 08/06/2023) for f/u anxiety.  Mort Sawyers, FNP

## 2023-02-03 NOTE — Assessment & Plan Note (Signed)
Suspected was from GI virus will repeat today pending results.

## 2023-02-03 NOTE — Assessment & Plan Note (Signed)
stable °

## 2023-02-03 NOTE — Assessment & Plan Note (Signed)
Increase sertraline to 100 mg once daily.

## 2023-02-03 NOTE — Assessment & Plan Note (Signed)
Seems to be improving Continue with bland diet as tolerating

## 2023-02-05 NOTE — Telephone Encounter (Signed)
Last office visit 02/03/2023 for hyperglycemia with Kathy Garcia.  Last refilled 12/23/2022 for #120 with no refills by Dr. Patsy Lager.  Next Appt:  No future appointments with PCP or [facility].

## 2023-02-09 ENCOUNTER — Telehealth: Payer: Self-pay | Admitting: Neurosurgery

## 2023-02-09 NOTE — Telephone Encounter (Signed)
Pt called in stating that [facility] ([facility]) is OON with her insurance. Says a friend went to and recommended [facility] for the injections. Wants to know if we can change the referral?

## 2023-02-09 NOTE — Telephone Encounter (Signed)
I called [facility] and they said that they sometimes do...the RN said to send the referral over and she would have the MD review and get back to [place] if he was willing to do it.

## 2023-03-10 ENCOUNTER — Ambulatory Visit: Payer: No Typology Code available for payment source | Admitting: Internal Medicine

## 2023-03-10 ENCOUNTER — Encounter: Payer: Self-pay | Admitting: Internal Medicine

## 2023-03-10 VITALS — BP 122/70 | HR 77 | Temp 97.4°F | Ht 63.0 in | Wt 149.0 lb

## 2023-03-10 DIAGNOSIS — S40011A Contusion of right shoulder, initial encounter: Secondary | ICD-10-CM | POA: Diagnosis not present

## 2023-03-10 NOTE — Progress Notes (Signed)
Subjective:    Patient ID: Kathy Garcia, female    DOB: 11-12-1965, 56 y.o.   MRN: [id number]  HPI Here due to pain after a fall  Larey Seat yesterday--tripped coming out of [employer] down on left knee and right forearm---then weight pushed forward and right shoulder hit ground With some momentum Shoved right shoulder forward--now having sig trouble moving shoulder Has been seeing Dr Marcell Barlow for chronic spine issues--couldn't get in there today  Bad pain in shoulder--throbbing Some pain in neck also--not new (?increased)  Taking gabapentin 300 tid regularly Rx for meloxicam now--instead of aleve Tylenol also  Current Outpatient Medications on File Prior to Visit  Medication Sig Dispense Refill   acetaminophen (TYLENOL) 500 MG tablet Take 500 mg by mouth every 6 (six) hours as needed.     albuterol (VENTOLIN HFA) 108 (90 Base) MCG/ACT inhaler Inhale 2 puffs into the lungs every 6 (six) hours as needed for wheezing or shortness of breath. 8 g 2   buPROPion (WELLBUTRIN XL) 150 MG 24 hr tablet Take 1 tablet (150 mg total) by mouth daily. 90 tablet 2   cholestyramine light (PREVALITE) 4 g packet Take 1 packet (4 g total) by mouth daily. 90 packet 3   diclofenac Sodium (VOLTAREN) 1 % GEL Apply 4 g topically 4 (four) times daily. 500 g 11   gabapentin (NEURONTIN) 300 MG capsule Take 1 capsule (300 mg total) by mouth 3 (three) times daily. 90 capsule 3   methocarbamol (ROBAXIN) 500 MG tablet TAKE 1 TABLET BY MOUTH EVERY 6 HOURS AS NEEDED FOR MUSCLE SPASMS. 120 tablet 1   Multiple Vitamin (MULTIVITAMIN) tablet Take 1 tablet by mouth daily.     naproxen (NAPROSYN) 250 MG tablet Take 250 mg by mouth 2 (two) times daily with a meal.     omeprazole (PRILOSEC) 20 MG capsule Take 1 capsule (20 mg total) by mouth 2 (two) times daily before a meal. 60 capsule 5   ondansetron (ZOFRAN-ODT) 4 MG disintegrating tablet Take 1 tablet (4 mg total) by mouth every 8 (eight) hours as needed for nausea  or vomiting. 12 tablet 0   sertraline (ZOLOFT) 100 MG tablet Take 1 tablet (100 mg total) by mouth daily. 90 tablet 3   No current facility-administered medications on file prior to visit.    Allergies  Allergen Reactions   Augmentin [Amoxicillin-Pot Clavulanate] Other (See Comments)    Diarrhea GI upset    Hydrocodone Nausea And Vomiting    N/v when doesn't eat    Past Medical History:  Diagnosis Date   Anemia    Anxiety    Arthritis    Asthma    Cervical dysplasia    Chronic abdominal pain    Chronic diarrhea    takes prevalite   Depression    Diverticulosis    GERD (gastroesophageal reflux disease)    Seasonal allergies    Wears glasses     Past Surgical History:  Procedure Laterality Date   CERVICAL CONIZATION W/BX N/A 12/16/2021   Procedure: LEEP  CONIZATION CERVIX WITH BIOPSY;  Surgeon: Reva Bores, MD;  Location: [facility] [place];  Service: Gynecology;  Laterality: N/A;   CERVIX LESION DESTRUCTION  1995   CHOLECYSTECTOMY, LAPAROSCOPIC  01/04/2013   @[facility]    COLONOSCOPY WITH PROPOFOL N/A 03/27/2021   Procedure: COLONOSCOPY WITH PROPOFOL;  Surgeon: Wyline Mood, MD;  Location: [facility];  Service: Gastroenterology;  Laterality: N/A;   ESOPHAGOGASTRODUODENOSCOPY (EGD) WITH PROPOFOL N/A 04/01/2022  Procedure: ESOPHAGOGASTRODUODENOSCOPY (EGD) WITH PROPOFOL;  Surgeon: Wyline Mood, MD;  Location: [facility];  Service: Gastroenterology;  Laterality: N/A;   PR ENDOMETRIAL BX CONJUNCT W/COLPOSCOPY  11/12/2021   TONSILLECTOMY  1972   TUBAL LIGATION Bilateral 1996    Family History  Problem Relation Age of Onset   Hypertension Mother    Alzheimer's disease Mother    Diverticulitis Mother    Cancer Father        unknown type   Colon cancer Maternal Aunt    Breast cancer Maternal Aunt    Heart failure Maternal Uncle    Stomach cancer Paternal Aunt    Brain cancer Paternal Aunt    Breast cancer Paternal Aunt     Social History   Socioeconomic  History   Marital status: Married    Spouse name: Sheria Lang   Number of children: 2   Years of education: some college   Highest education level: Not on file  Occupational History    Employer: [employer]  Tobacco Use   Smoking status: Every Day    Current packs/day: 0.25    Average packs/day: 0.3 packs/day for 25.0 years (6.3 ttl pk-yrs)    Types: Cigarettes   Smokeless tobacco: Never   Tobacco comments:    1 ppd x 20 years, quit for 10 years, restarted 2018  Vaping Use   Vaping status: Former  Substance and Sexual Activity   Alcohol use: Not Currently    Comment: none since 2018   Drug use: Never   Sexual activity: Yes    Partners: Male    Birth control/protection: Post-menopausal, Surgical  Other Topics Concern   Not on file  Social History Narrative   06/13/20   From: the area   Living: with husband Sheria Lang (2019), with husband's cousin Film/video editor   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      Family: 2 Scientist, research (medical) and Doctor, general practice - 3 grandchildren      Enjoys: writing, drawing, playing with cats, time with grandchildren      Exercise: not currently   Diet: nausea impacts diet      Safety   Seat belts: Yes    Guns: Yes  and secure   Safe in relationships: Yes    Social Determinants of Health   Financial Resource Strain: Not on file  Food Insecurity: Not on file  Transportation Needs: Not on file  Physical Activity: Not on file  Stress: Not on file  Social Connections: Not on file  Intimate Partner Violence: Not on file   Review of Systems Now seeing rheumatologist for her chronic pain issues--injection therapy     Objective:   Physical Exam Neck:     Comments: Spasm in traps and mildly reduced ROM due to pain No mass Musculoskeletal:     Comments: Right shoulder--no obvious contusions. No effusion. Active abduction to 90 degrees. Internal and external rotation are only mildly reduced  Left knee--?slight effusion. No ligament or meniscus  findings.ROM is full  Skin:    Comments: Abrasions on left knee and outside of right elbow--no infection            Assessment & Plan:

## 2023-03-10 NOTE — Assessment & Plan Note (Signed)
Nothing to suggest serious injury Discussed heat and rest. 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

## 2023-03-18 ENCOUNTER — Ambulatory Visit (INDEPENDENT_AMBULATORY_CARE_PROVIDER_SITE_OTHER): Payer: PRIVATE HEALTH INSURANCE | Admitting: Family Medicine

## 2023-03-18 ENCOUNTER — Encounter: Payer: Self-pay | Admitting: Family Medicine

## 2023-03-18 VITALS — BP 120/90 | HR 72 | Temp 97.3°F | Ht 63.0 in | Wt 140.4 lb

## 2023-03-18 DIAGNOSIS — R6883 Chills (without fever): Secondary | ICD-10-CM | POA: Diagnosis not present

## 2023-03-18 DIAGNOSIS — R197 Diarrhea, unspecified: Secondary | ICD-10-CM | POA: Diagnosis not present

## 2023-03-18 DIAGNOSIS — R112 Nausea with vomiting, unspecified: Secondary | ICD-10-CM

## 2023-03-18 LAB — POC COVID19 BINAXNOW: SARS Coronavirus 2 Ag: NEGATIVE

## 2023-03-18 MED ORDER — PROMETHAZINE HCL 25 MG/ML IJ SOLN
25.0000 mg | Freq: Once | INTRAMUSCULAR | Status: AC
Start: 2023-03-18 — End: 2023-03-18
  Administered 2023-03-18: 12.5 mg via INTRAMUSCULAR

## 2023-03-18 MED ORDER — PROMETHAZINE HCL 25 MG/ML IJ SOLN
12.5000 mg | Freq: Once | INTRAMUSCULAR | Status: DC
Start: 2023-03-18 — End: 2023-03-18

## 2023-03-18 NOTE — Progress Notes (Signed)
Patient ID: Kathy Garcia, female    DOB: February 26, 1966, 57 y.o.   MRN: [id number]  This visit was conducted in person.  BP (!) 120/90 (BP Location: Left Arm, Patient Position: Sitting, Cuff Size: Normal)   Pulse 72   Temp (!) 97.3 F (36.3 C) (Temporal)   Ht 5\' 3"  (1.6 m)   Wt 140 lb 6 oz (63.7 kg)   LMP 12/01/2012 (Exact Date)   SpO2 99%   BMI 24.87 kg/m    CC:  Chief Complaint  Patient presents with   Chills   Vomiting   Diarrhea   Shoulder Pain    Subjective:   HPI: Kathy Garcia is a 57 y.o. female patient of Mort Sawyers, NP  with history of chronic diarrhea, chronic  pain presenting on 03/18/2023 for Chills, Vomiting, Diarrhea, and Shoulder Pain   New onset  today with vomiting,  subjective fever, sweating.  Progressed to diarrhea.   She has continued to feel nauseous.   No liquid intake today. Last fluids yesterday evening.   Some stomach pain, upper.   She has urinated here today.   No sick contact,  no " bad' food.   She did fall last week... following this right shoulder pain.    Saw Dr. Alphonsus Sias for contusion of shoulder.   She took one Zofran this AM but threw it up.   Relevant past medical, surgical, family and social history reviewed and updated as indicated. Interim medical history since our last visit reviewed. Allergies and medications reviewed and updated. Outpatient Medications Prior to Visit  Medication Sig Dispense Refill   acetaminophen (TYLENOL) 500 MG tablet Take 500 mg by mouth every 6 (six) hours as needed.     albuterol (VENTOLIN HFA) 108 (90 Base) MCG/ACT inhaler Inhale 2 puffs into the lungs every 6 (six) hours as needed for wheezing or shortness of breath. 8 g 2   buPROPion (WELLBUTRIN XL) 150 MG 24 hr tablet Take 1 tablet (150 mg total) by mouth daily. 90 tablet 2   cholestyramine light (PREVALITE) 4 g packet Take 1 packet (4 g total) by mouth daily. 90 packet 3   diclofenac Sodium (VOLTAREN) 1 % GEL Apply 4 g topically 4 (four)  times daily. 500 g 11   gabapentin (NEURONTIN) 300 MG capsule Take 1 capsule (300 mg total) by mouth 3 (three) times daily. 90 capsule 3   meloxicam (MOBIC) 15 MG tablet Take 15 mg by mouth daily.     methocarbamol (ROBAXIN) 500 MG tablet TAKE 1 TABLET BY MOUTH EVERY 6 HOURS AS NEEDED FOR MUSCLE SPASMS. 120 tablet 1   Multiple Vitamin (MULTIVITAMIN) tablet Take 1 tablet by mouth daily.     naproxen (NAPROSYN) 250 MG tablet Take 250 mg by mouth 2 (two) times daily with a meal.     omeprazole (PRILOSEC) 20 MG capsule Take 1 capsule (20 mg total) by mouth 2 (two) times daily before a meal. 60 capsule 5   ondansetron (ZOFRAN-ODT) 4 MG disintegrating tablet Take 1 tablet (4 mg total) by mouth every 8 (eight) hours as needed for nausea or vomiting. 12 tablet 0   sertraline (ZOLOFT) 100 MG tablet Take 1 tablet (100 mg total) by mouth daily. 90 tablet 3   No facility-administered medications prior to visit.     Per HPI unless specifically indicated in ROS section below Review of Systems  Constitutional:  Positive for chills and fever. Negative for fatigue.  HENT:  Negative for congestion.  Eyes:  Negative for pain.  Respiratory:  Negative for cough and shortness of breath.   Cardiovascular:  Negative for chest pain, palpitations and leg swelling.  Gastrointestinal:  Positive for abdominal pain, diarrhea, nausea and vomiting.  Genitourinary:  Negative for dysuria and vaginal bleeding.  Musculoskeletal:  Negative for back pain.  Neurological:  Negative for syncope, light-headedness and headaches.  Psychiatric/Behavioral:  Negative for dysphoric mood.    Objective:  BP (!) 120/90 (BP Location: Left Arm, Patient Position: Sitting, Cuff Size: Normal)   Pulse 72   Temp (!) 97.3 F (36.3 C) (Temporal)   Ht 5\' 3"  (1.6 m)   Wt 140 lb 6 oz (63.7 kg)   LMP 12/01/2012 (Exact Date)   SpO2 99%   BMI 24.87 kg/m   Wt Readings from Last 3 Encounters:  03/18/23 140 lb 6 oz (63.7 kg)  03/10/23 149 lb  (67.6 kg)  02/03/23 142 lb 12.8 oz (64.8 kg)      Physical Exam Constitutional:      General: She is not in acute distress.    Appearance: Normal appearance. She is well-developed. She is not ill-appearing or toxic-appearing.  HENT:     Head: Normocephalic.     Right Ear: Hearing, tympanic membrane, ear canal and external ear normal. Tympanic membrane is not erythematous, retracted or bulging.     Left Ear: Hearing, tympanic membrane, ear canal and external ear normal. Tympanic membrane is not erythematous, retracted or bulging.     Nose: No mucosal edema or rhinorrhea.     Right Sinus: No maxillary sinus tenderness or frontal sinus tenderness.     Left Sinus: No maxillary sinus tenderness or frontal sinus tenderness.     Mouth/Throat:     Mouth: Oropharynx is clear and moist and mucous membranes are normal.     Pharynx: Uvula midline.  Eyes:     General: Lids are normal. Lids are everted, no foreign bodies appreciated.     Extraocular Movements: EOM normal.     Conjunctiva/sclera: Conjunctivae normal.     Pupils: Pupils are equal, round, and reactive to light.  Neck:     Thyroid: No thyroid mass or thyromegaly.     Vascular: No carotid bruit.     Trachea: Trachea normal.  Cardiovascular:     Rate and Rhythm: Normal rate and regular rhythm.     Pulses: Normal pulses.     Heart sounds: Normal heart sounds, S1 normal and S2 normal. No murmur heard.    No friction rub. No gallop.  Pulmonary:     Effort: Pulmonary effort is normal. No tachypnea or respiratory distress.     Breath sounds: Normal breath sounds. No decreased breath sounds, wheezing, rhonchi or rales.  Abdominal:     General: Bowel sounds are normal.     Palpations: Abdomen is soft.     Tenderness: There is abdominal tenderness.  Musculoskeletal:     Cervical back: Normal range of motion and neck supple.  Skin:    General: Skin is warm, dry and intact.     Findings: No rash.  Neurological:     Mental Status: She  is alert.  Psychiatric:        Mood and Affect: Mood is not anxious or depressed.        Speech: Speech normal.        Behavior: Behavior normal. Behavior is cooperative.        Thought Content: Thought content normal.  Cognition and Memory: Cognition and memory normal.        Judgment: Judgment normal.       Results for orders placed or performed in visit on 03/18/23  POC COVID-19  Result Value Ref Range   SARS Coronavirus 2 Ag Negative Negative    Assessment and Plan  Chills -     POC COVID-19 BinaxNow  Nausea vomiting and diarrhea Assessment & Plan: Acute, most likely viral gastroenteritis.  Negative COVID test in office today. Constant retching while in the office.  Phenergan IM 12 mg given x 1 with good result. Keep up with  fluids like Gatorade, start with sips  every 5 minutes and gradually advance. Advance diet as tolerated.  Can use your Zofran as needed for vomiting.... Offered Phenergan suppository but patient refused.  GO to ER if unable to keep fluids down in next  6 hours or if uncontrollable vomiting.   Orders: -     Promethazine HCl    No follow-ups on file.   Kerby Nora, MD

## 2023-03-18 NOTE — Assessment & Plan Note (Signed)
Acute, most likely viral gastroenteritis.  Negative COVID test in office today. Constant retching while in the office.  Phenergan IM 12 mg given x 1 with good result. Keep up with  fluids like Gatorade, start with sips  every 5 minutes and gradually advance. Advance diet as tolerated.  Can use your Zofran as needed for vomiting.... Offered Phenergan suppository but patient refused.  GO to ER if unable to keep fluids down in next  6 hours or if uncontrollable vomiting.

## 2023-03-18 NOTE — Patient Instructions (Signed)
Keep up with  fluids like Gatorade, start with sips  every 5 minutes and gradually advance. Advance diet as tolerated.  Can your Zofran as needed for vomiting.  GO to ER if unable to keep fluids down in next  6 hours or if uncontrollable vomiting.

## 2023-03-23 ENCOUNTER — Ambulatory Visit: Payer: PRIVATE HEALTH INSURANCE | Admitting: Neurosurgery

## 2023-03-25 ENCOUNTER — Ambulatory Visit: Payer: PRIVATE HEALTH INSURANCE | Admitting: Neurosurgery

## 2023-03-29 ENCOUNTER — Telehealth: Payer: Self-pay

## 2023-03-29 DIAGNOSIS — R634 Abnormal weight loss: Secondary | ICD-10-CM

## 2023-03-29 DIAGNOSIS — K9089 Other intestinal malabsorption: Secondary | ICD-10-CM

## 2023-03-29 NOTE — Telephone Encounter (Signed)
Pt left a vm stating that ever since she had a GI bug a few weeks ago, she has had really dark stool. She is requesting an office appt. Please advise of a recommendation or when you can see her in the clinic.

## 2023-03-30 NOTE — Telephone Encounter (Signed)
Pt was seen by her PCP for the nausea, vomiting and diarrhea, but was not having black stools at that time. Would you like me to order a CBC prior to scheduling appointment?

## 2023-03-30 NOTE — Telephone Encounter (Signed)
Yes please and ensure not on pepto, iron

## 2023-03-30 NOTE — Telephone Encounter (Signed)
Patient call back I did inform her that Dr. Tobi Bastos want to have labs done.

## 2023-03-31 ENCOUNTER — Other Ambulatory Visit: Payer: Self-pay

## 2023-03-31 DIAGNOSIS — R634 Abnormal weight loss: Secondary | ICD-10-CM

## 2023-03-31 DIAGNOSIS — Z8619 Personal history of other infectious and parasitic diseases: Secondary | ICD-10-CM

## 2023-03-31 NOTE — Telephone Encounter (Signed)
Patient came in the office and I was able to order the CBC lab for her. Dr. Tobi Bastos, can you let know when you have a chance when you would like to add her in your schedule.

## 2023-03-31 NOTE — Telephone Encounter (Signed)
Lets get the results of the labs - if stable there is nothing much to do just wait

## 2023-04-01 LAB — CBC WITH DIFFERENTIAL/PLATELET
Basophils Absolute: 0.1 10*3/uL (ref 0.0–0.2)
Basos: 1 %
EOS (ABSOLUTE): 0.2 10*3/uL (ref 0.0–0.4)
Eos: 3 %
Hematocrit: 37.6 % (ref 34.0–46.6)
Hemoglobin: 12.1 g/dL (ref 11.1–15.9)
Immature Grans (Abs): 0 10*3/uL (ref 0.0–0.1)
Immature Granulocytes: 0 %
Lymphocytes Absolute: 2 10*3/uL (ref 0.7–3.1)
Lymphs: 27 %
MCH: 32.5 pg (ref 26.6–33.0)
MCHC: 32.2 g/dL (ref 31.5–35.7)
MCV: 101 fL — ABNORMAL HIGH (ref 79–97)
Monocytes Absolute: 0.4 10*3/uL (ref 0.1–0.9)
Monocytes: 6 %
Neutrophils Absolute: 4.8 10*3/uL (ref 1.4–7.0)
Neutrophils: 63 %
Platelets: 221 10*3/uL (ref 150–450)
RBC: 3.72 x10E6/uL — ABNORMAL LOW (ref 3.77–5.28)
RDW: 11.9 % (ref 11.7–15.4)
WBC: 7.6 10*3/uL (ref 3.4–10.8)

## 2023-04-01 NOTE — Telephone Encounter (Signed)
Hb stable at 12- ask her how she is doing - ensure no pepto , no nsaids, ensure on PPI- we can check hb in 2 weeks to ensure still stable.

## 2023-04-02 ENCOUNTER — Telehealth: Payer: Self-pay | Admitting: Gastroenterology

## 2023-04-02 NOTE — Addendum Note (Signed)
Addended by: Adela Ports on: 04/02/2023 10:00 AM   Modules accepted: Orders

## 2023-04-02 NOTE — Telephone Encounter (Signed)
Called patient and had to leave her a voicemail since she didn't pick up. I let her know what Dr. Tobi Bastos stated on the below. I also reminded her that in 2 weeks, Dr. Tobi Bastos wants to recheck her hb to make sure she is still stable.

## 2023-04-02 NOTE — Telephone Encounter (Signed)
I called patient back and she was not available and I left a voicemail to call back.

## 2023-07-05 ENCOUNTER — Other Ambulatory Visit: Payer: Self-pay | Admitting: Gastroenterology

## 2023-10-08 ENCOUNTER — Telehealth: Payer: Self-pay | Admitting: Family

## 2023-10-08 NOTE — Telephone Encounter (Signed)
 Spoke with pt, she in need of her mammogram but she does not have insurance at this time. Pt will call us back once she gets coverage.

## 2023-12-21 IMAGING — US US ABDOMEN COMPLETE
2 series · 15 of 25 positions shown · non-contrast
Comparison: CT abdomen/pelvis 01/28/2021

CLINICAL DATA: Chronic pelvic pain

EXAM:
ABDOMEN ULTRASOUND COMPLETE

[Series 1: us abdomen complete · 14 of 58 slices shown (1 of 2)]
[im 1/58]
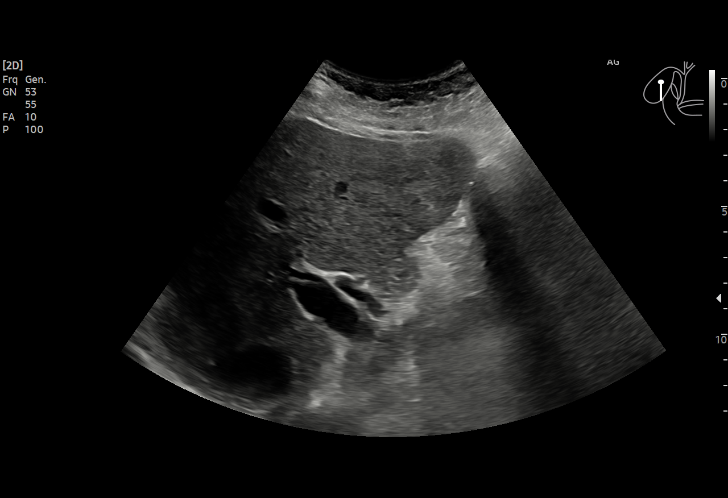
[im 5/58]
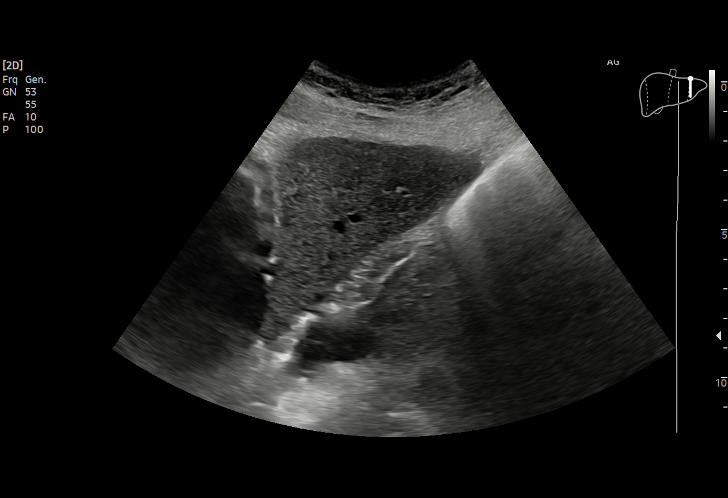
[im 10/58]
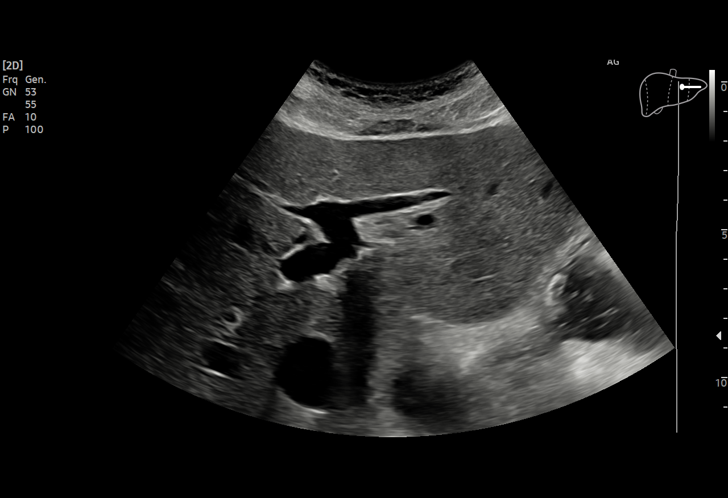
[im 13/58]
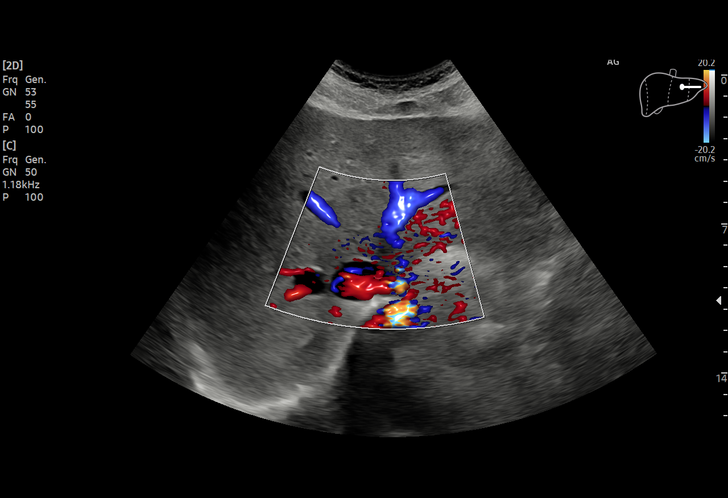
[im 18/58]
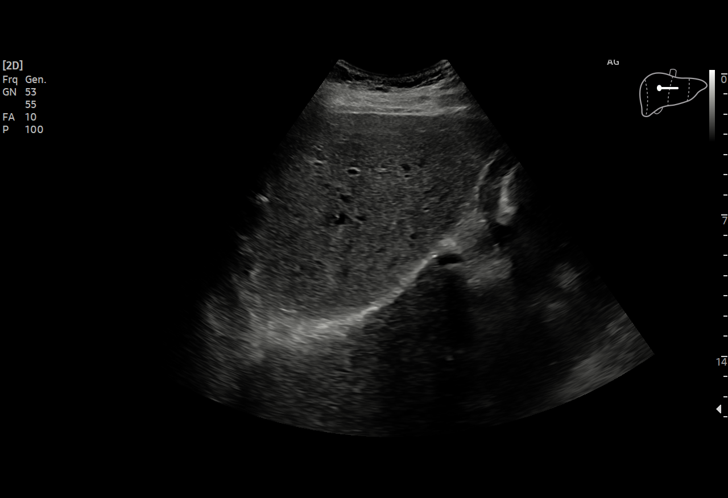
[im 23/58]
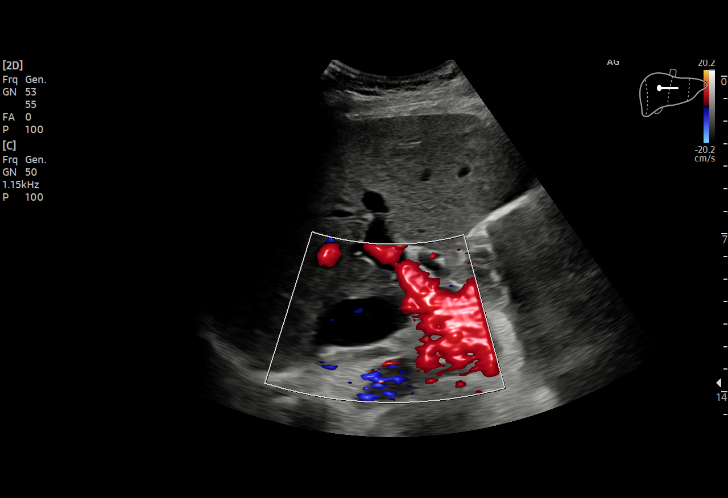
[im 25/58]
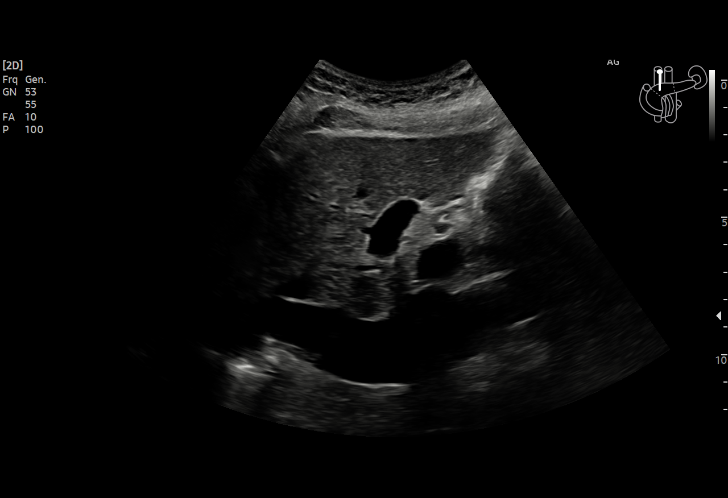
[im 30/58]
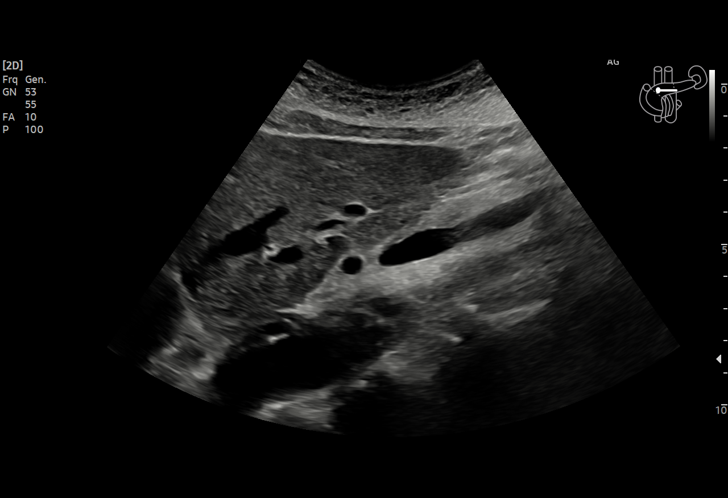
[im 35/58]
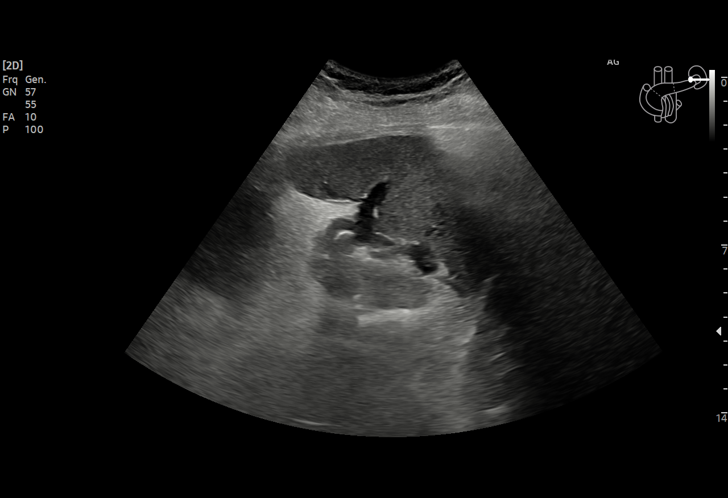
[im 38/58]
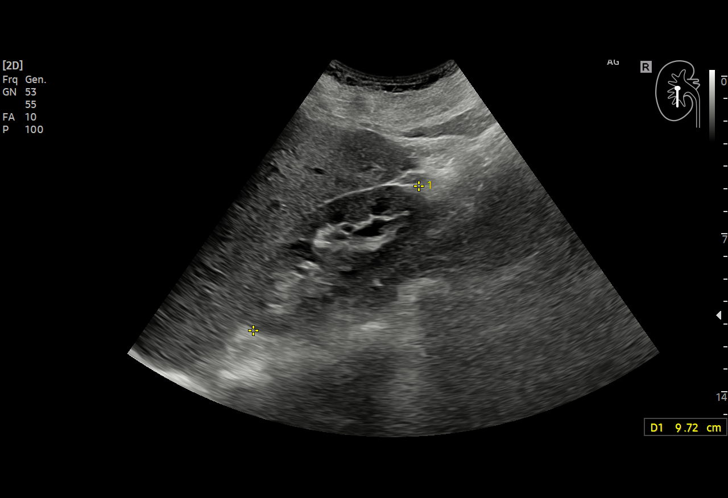
[im 43/58]
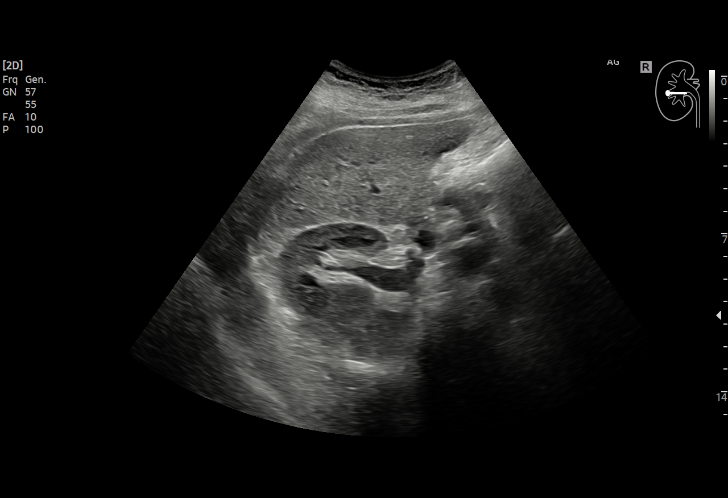
[im 48/58]
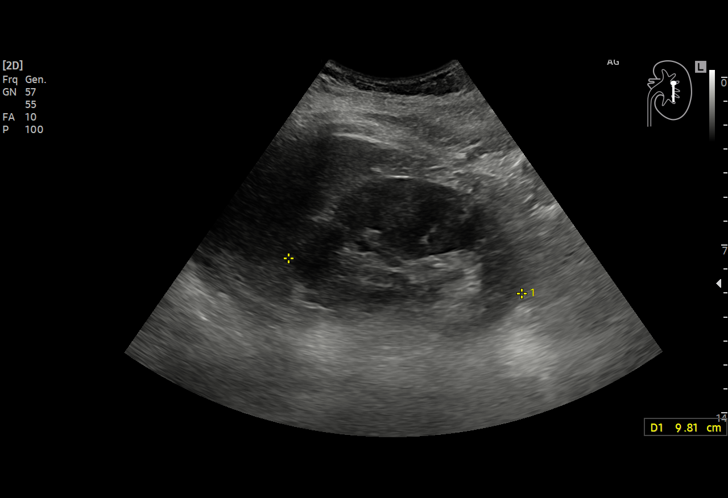
[im 50/58]
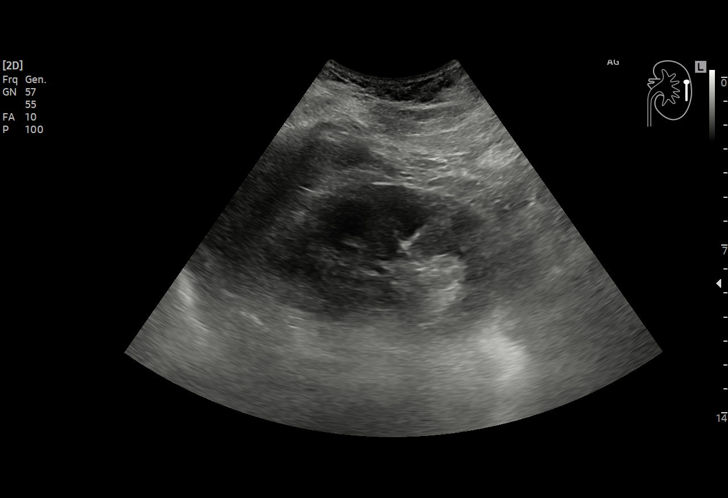
[im 55/58]
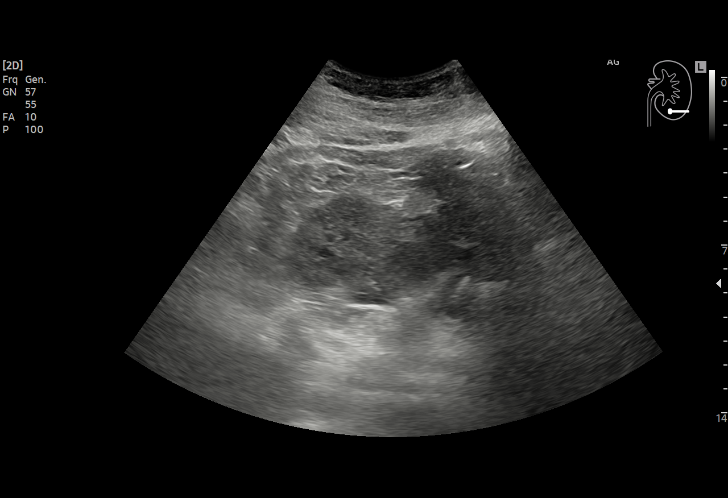

[Series 2: us abdomen complete · 1 of 1 slices shown (2 of 2)]
[im 1/1]
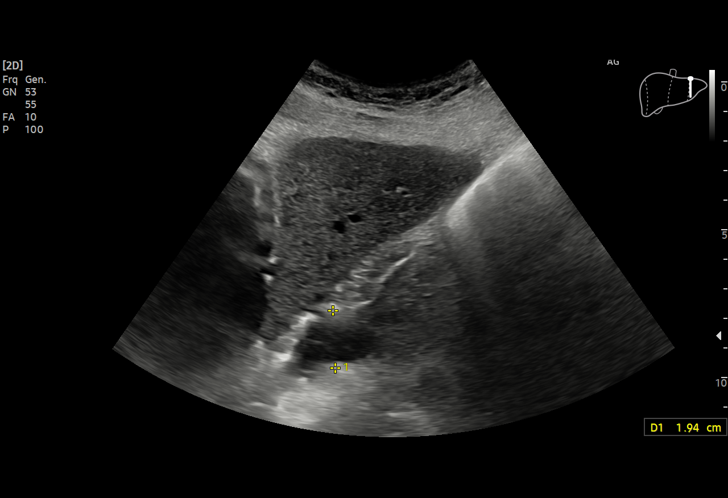

[15 of 25 positions shown; findings below may reference images not displayed]

FINDINGS: Gallbladder: Surgically absent.

Common bile duct: Diameter: 4 mm

Liver: No focal lesion identified. Within normal limits in
parenchymal echogenicity. Portal vein is patent on color Doppler
imaging with normal direction of blood flow towards the liver.

IVC: No abnormality visualized.

Pancreas: Visualized portion unremarkable.

Spleen: Size and appearance within normal limits.

Right Kidney: Length: 9.7 cm. Echogenicity within normal limits. No
mass or hydronephrosis visualized.

Left Kidney: Length: 9.8 cm. Echogenicity within normal limits. No
mass or hydronephrosis visualized.

Abdominal aorta: No aneurysm visualized.

Other findings: None.
IMPRESSION: Status post cholecystectomy.  No acute finding.

## 2023-12-21 IMAGING — US US PELVIS COMPLETE WITH TRANSVAGINAL
1 series · 15 of 25 positions shown · non-contrast
Comparison: None

CLINICAL DATA: Chronic RIGHT-side abdominal pain, post menopausal



[Series 1: us pelvis complete · 15 of 53 slices shown]
[im 1/53]
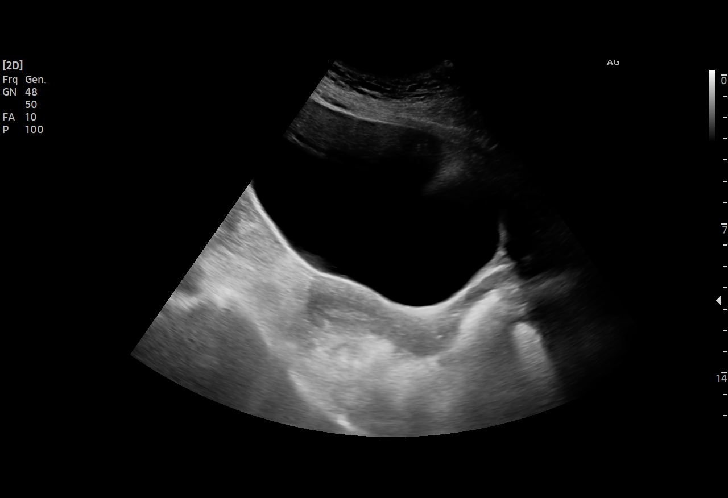
[im 5/53]
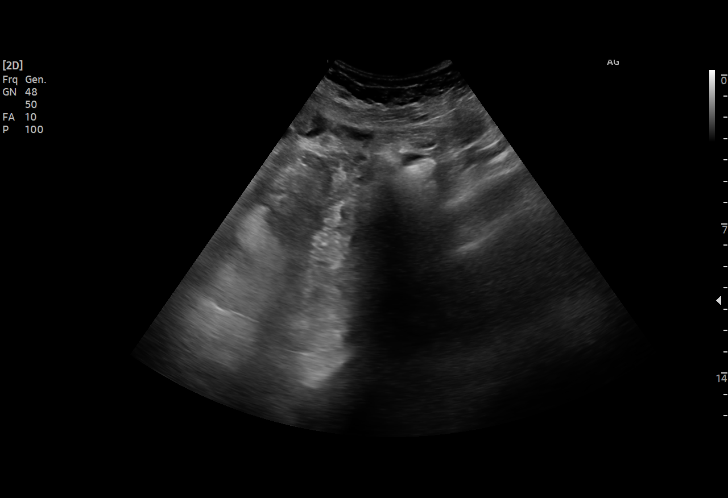
[im 9/53]
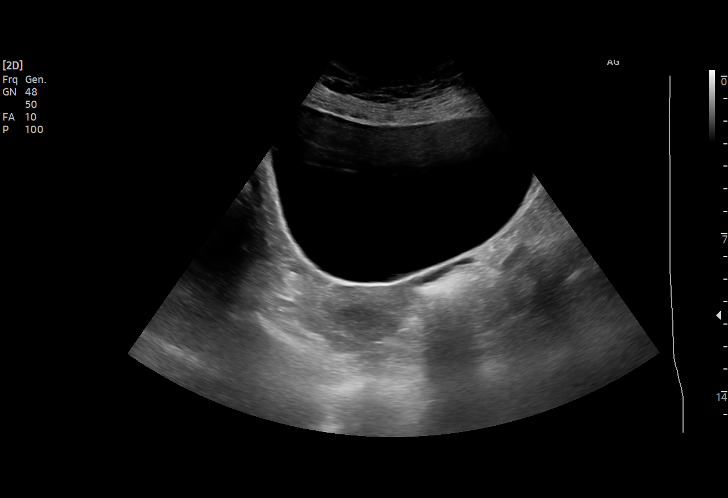
[im 11/53]
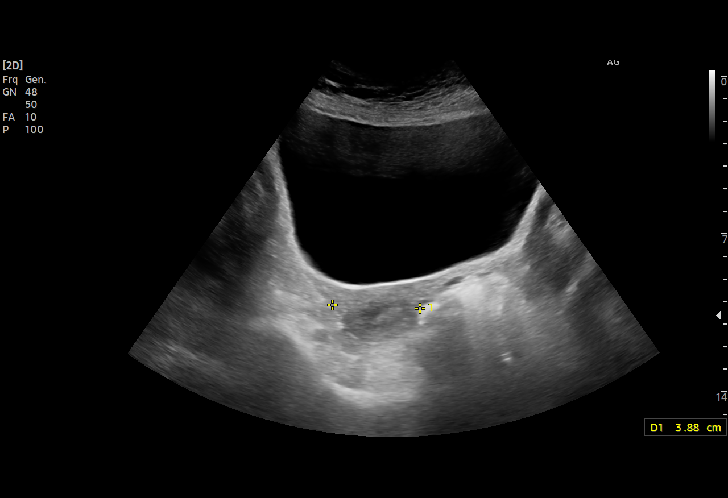
[im 16/53]
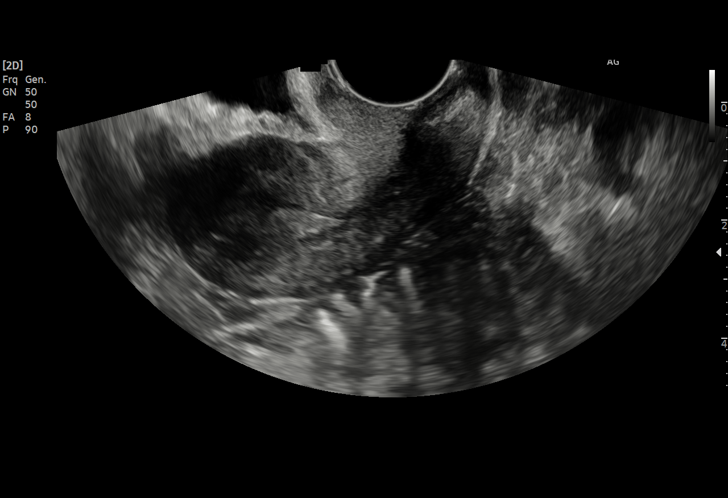
[im 20/53]
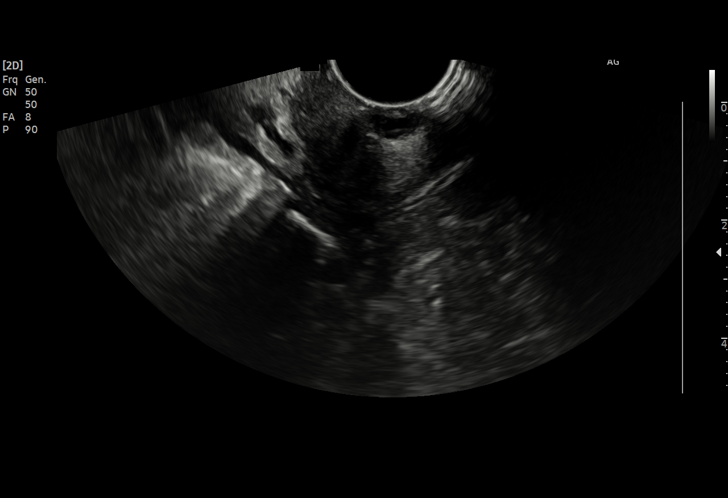
[im 22/53]
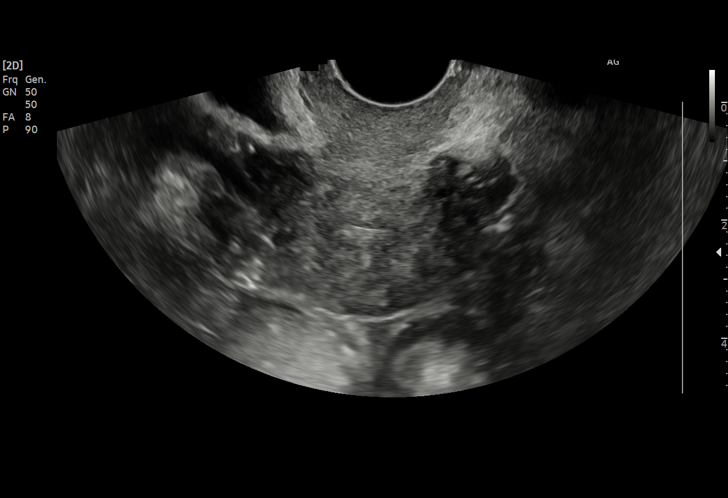
[im 27/53]
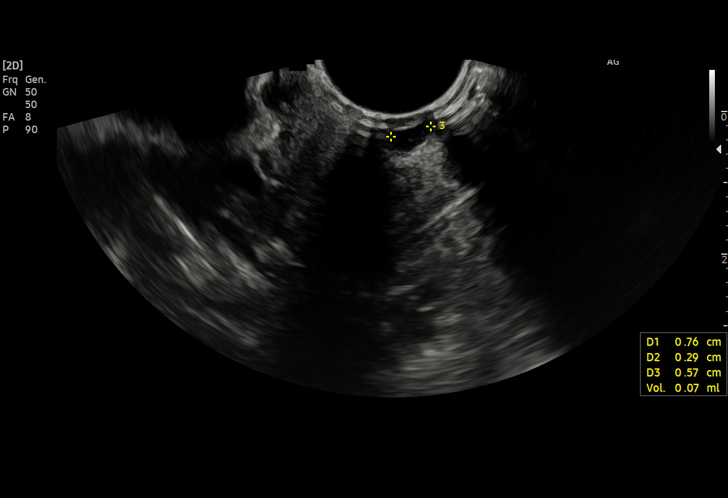
[im 31/53]
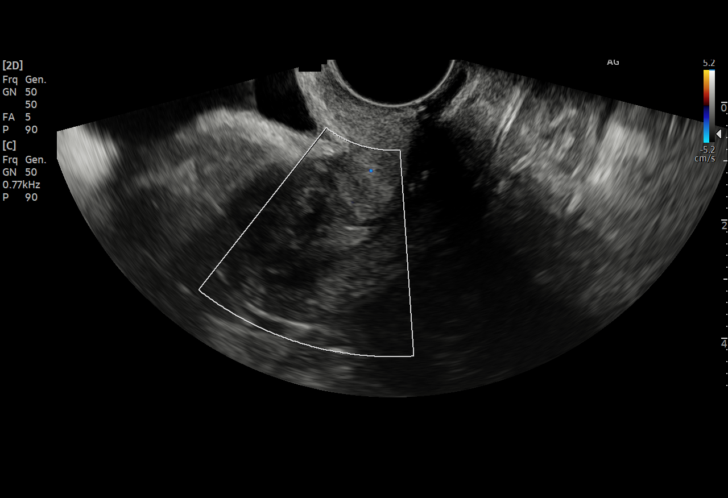
[im 33/53]
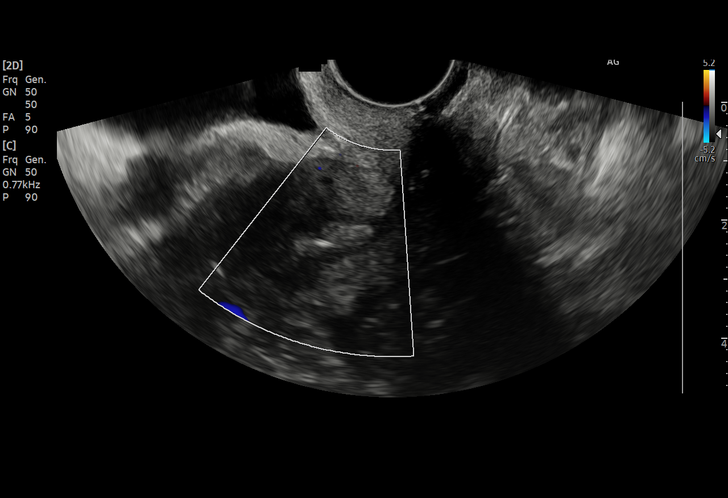
[im 37/53]
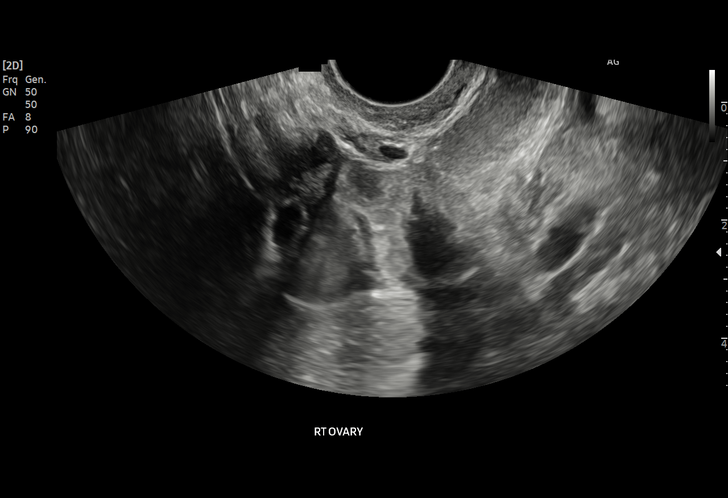
[im 42/53]
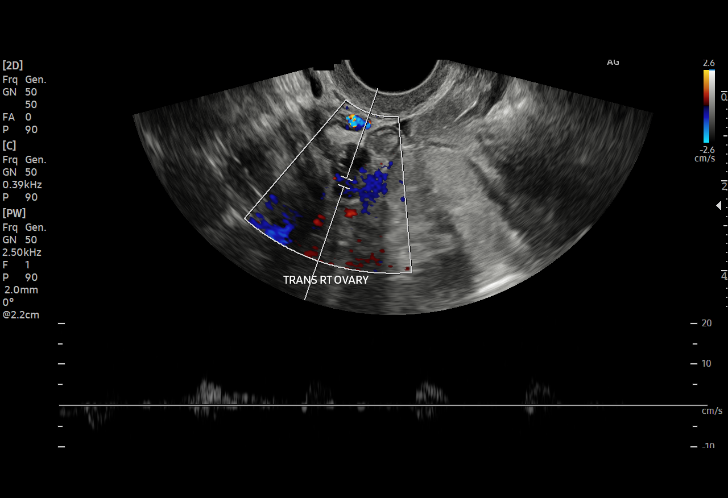
[im 44/53]
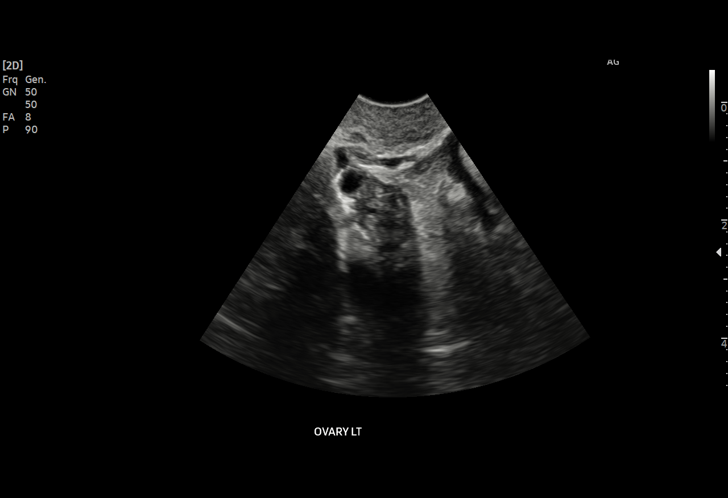
[im 48/53]
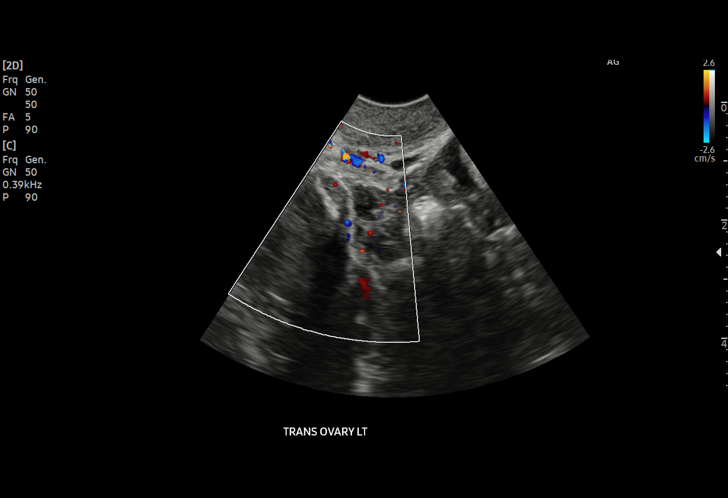
[im 53/53]
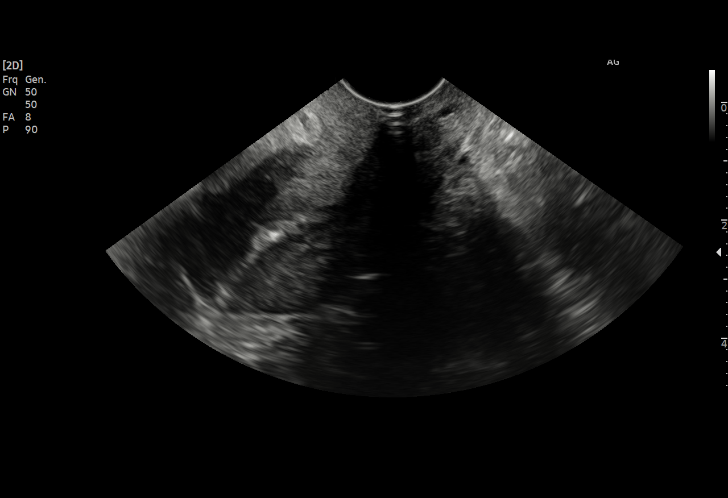

[15 of 25 positions shown; findings below may reference images not displayed]

FINDINGS: Uterus

Measurements: 6.0 x 2.8 x 4.6 cm = volume: 40 mL. Anteverted. Normal
morphology without mass

Endometrium

Thickness: 7 mm. Echogenic focus within upper uterine endometrial
complex likely small calcification 3 mm diameter. No discrete
endometrial fluid or mass

Right ovary

Measurements: 1.8 x 0.8 x 1.9 cm = volume: 1 mL. Normal morphology
without mass

Left ovary

Measurements: 1.4 x 1.1 x 1.0 cm = volume: 0.8 mL. Normal morphology
without mass

Other findings

Small amount of nonspecific free pelvic fluid.  No adnexal masses.
IMPRESSION: 7 mm thick endometrial complex, abnormal for a postmenopausal
patient with bleeding; in the setting of post-menopausal bleeding,
endometrial sampling is indicated to exclude carcinoma. If results
are benign, sonohysterogram should be considered for focal lesion
work-up. (Ref: Radiological Reasoning: Algorithmic Workup of
Abnormal Vaginal Bleeding with Endovaginal Sonography and
Sonohysterography. [id number]; [id number])

## 2024-01-31 ENCOUNTER — Telehealth: Payer: Self-pay

## 2024-01-31 ENCOUNTER — Other Ambulatory Visit: Payer: Self-pay

## 2024-01-31 ENCOUNTER — Other Ambulatory Visit
Admission: RE | Admit: 2024-01-31 | Discharge: 2024-01-31 | Disposition: A | Discharge status: Home / Self Care | Payer: Self-pay | Source: Ambulatory Visit | Attending: Gastroenterology | Admitting: Gastroenterology

## 2024-01-31 DIAGNOSIS — D72829 Elevated white blood cell count, unspecified: Secondary | ICD-10-CM | POA: Insufficient documentation

## 2024-01-31 DIAGNOSIS — R739 Hyperglycemia, unspecified: Secondary | ICD-10-CM | POA: Insufficient documentation

## 2024-01-31 DIAGNOSIS — Z8619 Personal history of other infectious and parasitic diseases: Secondary | ICD-10-CM

## 2024-01-31 DIAGNOSIS — R110 Nausea: Secondary | ICD-10-CM

## 2024-01-31 DIAGNOSIS — R140 Abdominal distension (gaseous): Secondary | ICD-10-CM

## 2024-01-31 DIAGNOSIS — K529 Noninfective gastroenteritis and colitis, unspecified: Secondary | ICD-10-CM

## 2024-01-31 LAB — CBC WITH DIFFERENTIAL/PLATELET
Abs Immature Granulocytes: 0.03 10*3/uL (ref 0.00–0.07)
Basophils Absolute: 0.1 10*3/uL (ref 0.0–0.1)
Basophils Relative: 1 %
Eosinophils Absolute: 0.1 10*3/uL (ref 0.0–0.5)
Eosinophils Relative: 1 %
HCT: 36.5 % (ref 36.0–46.0)
Hemoglobin: 12 g/dL (ref 12.0–15.0)
Immature Granulocytes: 0 %
Lymphocytes Relative: 29 %
Lymphs Abs: 2.4 10*3/uL (ref 0.7–4.0)
MCH: 33.2 pg (ref 26.0–34.0)
MCHC: 32.9 g/dL (ref 30.0–36.0)
MCV: 101.1 fL — ABNORMAL HIGH (ref 80.0–100.0)
Monocytes Absolute: 0.5 10*3/uL (ref 0.1–1.0)
Monocytes Relative: 6 %
Neutro Abs: 5.2 10*3/uL (ref 1.7–7.7)
Neutrophils Relative %: 63 %
Platelets: 226 10*3/uL (ref 150–400)
RBC: 3.61 MIL/uL — ABNORMAL LOW (ref 3.87–5.11)
RDW: 12.5 % (ref 11.5–15.5)
WBC: 8.3 10*3/uL (ref 4.0–10.5)
nRBC: 0 % (ref 0.0–0.2)

## 2024-01-31 LAB — HEMOGLOBIN A1C
Hgb A1c MFr Bld: 4.8 % (ref 4.8–5.6)
Mean Plasma Glucose: 91.06 mg/dL

## 2024-01-31 NOTE — Telephone Encounter (Signed)
 Pt came into the office to voice her concerns regarding her ongoing Sx. Pt has been experiencing abd bloating, nausea, burping and smelly diarrhea... Pt reports that she had tried to hold marijuana use x 3 weeks as previously advise from Dr Therisa, with no relief in Sx. Pt does not currently have insurance and is awaiting approval for [facility] financial assistance, so pt states she is unable to follow Dr Therisa at this time, although she loved being in his care. I called Dr Jinny to see if he would be willing to cosign orders for stool studies due to the patient's Hx of C diff... Per Dr Jinny, [facility] to send tests  C diff GI panel H Pylori   Have been ordered for [facility] lab due to pt's current status of waiting to hear back about patient assistance  Also, pt has been scheduled with Ellouise Console, PA with [facility] to be evaluated for her Sx  Pt has also been advised to reach out to PCP in the meantime should Sx worsen or have additional concerns  Appt has been set on waiting list as well

## 2024-01-31 NOTE — Progress Notes (Unsigned)
 C diff

## 2024-02-01 ENCOUNTER — Other Ambulatory Visit
Admission: RE | Admit: 2024-02-01 | Discharge: 2024-02-01 | Disposition: A | Discharge status: Home / Self Care | Payer: Self-pay | Source: Ambulatory Visit | Attending: Gastroenterology | Admitting: Gastroenterology

## 2024-02-01 ENCOUNTER — Ambulatory Visit: Payer: Self-pay | Admitting: Gastroenterology

## 2024-02-01 ENCOUNTER — Ambulatory Visit: Payer: Self-pay | Admitting: Family

## 2024-02-01 DIAGNOSIS — R140 Abdominal distension (gaseous): Secondary | ICD-10-CM | POA: Insufficient documentation

## 2024-02-01 DIAGNOSIS — K529 Noninfective gastroenteritis and colitis, unspecified: Secondary | ICD-10-CM | POA: Insufficient documentation

## 2024-02-01 DIAGNOSIS — Z8619 Personal history of other infectious and parasitic diseases: Secondary | ICD-10-CM | POA: Insufficient documentation

## 2024-02-01 LAB — GASTROINTESTINAL PANEL BY PCR, STOOL (REPLACES STOOL CULTURE)

## 2024-02-01 LAB — C DIFFICILE QUICK SCREEN W PCR REFLEX
C Diff antigen: NEGATIVE
C Diff interpretation: NOT DETECTED
C Diff toxin: NEGATIVE

## 2024-02-01 NOTE — Progress Notes (Signed)
 Labs remain stable.

## 2024-02-03 LAB — H. PYLORI ANTIGEN, STOOL: H. Pylori Stool Ag, Eia: NEGATIVE

## 2024-03-08 ENCOUNTER — Telehealth: Payer: Self-pay | Admitting: Family

## 2024-03-08 NOTE — Telephone Encounter (Unsigned)
 Copied from CRM [id number]. Topic: General - Billing Inquiry >> Mar 08, 2024 11:22 AM Gennette ORN wrote: Reason for CRM: Patient is calling in to see how much would it be for check up about her ear.

## 2024-03-08 NOTE — Telephone Encounter (Signed)
 Unable to leave vm, or MyChart. Est complete for visit 63.00 patient can pay a part of that at check in.

## 2024-03-10 ENCOUNTER — Encounter: Payer: Self-pay | Admitting: Family Medicine

## 2024-03-10 ENCOUNTER — Ambulatory Visit (INDEPENDENT_AMBULATORY_CARE_PROVIDER_SITE_OTHER): Payer: Self-pay | Admitting: Family Medicine

## 2024-03-10 VITALS — BP 130/80 | HR 72 | Temp 97.1°F | Ht 63.0 in | Wt 162.0 lb

## 2024-03-10 DIAGNOSIS — H6123 Impacted cerumen, bilateral: Secondary | ICD-10-CM

## 2024-03-10 NOTE — Assessment & Plan Note (Signed)
 Cerumen impaction removal via irrigation Performed by :  Arland Consent for procedure obtained verbally. Bilateral ears lavaged/ irrigated with warm water gently. Pt tolerated procedure well, with no complications. After procedure ears clear of cerumen impaction, with minimal ear canal irritation and redness, no bleeding. TM intact and symptoms improved. Still mild hearing loss so recommended free hearing screening at [facility].

## 2024-03-10 NOTE — Progress Notes (Signed)
 Patient ID: Kathy Garcia, female    DOB: 11/22/1965, 58 y.o.   MRN: [id number]  This visit was conducted in person.  Ht 5' 3 (1.6 m)   LMP 12/01/2012 (Exact Date)   BMI 24.87 kg/m    CC:  Chief Complaint  Patient presents with   Cerumen Impaction    Subjective:   HPI: Kathy Garcia is a 58 y.o. female presenting on 03/10/2024 for Cerumen Impaction   She has noted decreased hearing  for a long time, years. Both ears. Sometime hears ringing in ears/white noise sounding  No ear pain, no URI symptoms. Had free testing at [facility]... told she has ear wax impaction. No fever, no headache.     She reports many chonric issues, multiple concerns, including chronic body pain leg pain chronic diarrhea and her concern that she may have MS. I did review her 2024 MRIs of cervical and lumbar spine which did not show any sign of MS changes.  She was supposed to move forward with surgery but was unable to get she lost her job.. Trouble affording doctor bills but has received info on  [facility] financial assistance and is in process of setting this up. Encouraged her to follow-up with her PCP to go through her concerns.  Relevant past medical, surgical, family and social history reviewed and updated as indicated. Interim medical history since our last visit reviewed. Allergies and medications reviewed and updated. Outpatient Medications Prior to Visit  Medication Sig Dispense Refill   acetaminophen  (TYLENOL ) 500 MG tablet Take 500 mg by mouth every 6 (six) hours as needed.     albuterol  (VENTOLIN  HFA) 108 (90 Base) MCG/ACT inhaler Inhale 2 puffs into the lungs every 6 (six) hours as needed for wheezing or shortness of breath. 8 g 2   buPROPion  (WELLBUTRIN  XL) 150 MG 24 hr tablet Take 1 tablet (150 mg total) by mouth daily. 90 tablet 2   cholestyramine  light (PREVALITE ) 4 g packet TAKE 1 PACKET BY MOUTH DAILY. 90 packet 3   diclofenac  Sodium (VOLTAREN ) 1 % GEL Apply 4 g topically 4 (four)  times daily. 500 g 11   gabapentin  (NEURONTIN ) 300 MG capsule Take 1 capsule (300 mg total) by mouth 3 (three) times daily. 90 capsule 3   meloxicam  (MOBIC ) 15 MG tablet Take 15 mg by mouth daily.     methocarbamol  (ROBAXIN ) 500 MG tablet TAKE 1 TABLET BY MOUTH EVERY 6 HOURS AS NEEDED FOR MUSCLE SPASMS. 120 tablet 1   Multiple Vitamin (MULTIVITAMIN) tablet Take 1 tablet by mouth daily.     naproxen  (NAPROSYN ) 250 MG tablet Take 250 mg by mouth 2 (two) times daily with a meal.     omeprazole  (PRILOSEC ) 20 MG capsule Take 1 capsule (20 mg total) by mouth 2 (two) times daily before a meal. 60 capsule 5   ondansetron  (ZOFRAN -ODT) 4 MG disintegrating tablet Take 1 tablet (4 mg total) by mouth every 8 (eight) hours as needed for nausea or vomiting. 12 tablet 0   sertraline  (ZOLOFT ) 100 MG tablet Take 1 tablet (100 mg total) by mouth daily. 90 tablet 3   No facility-administered medications prior to visit.     Per HPI unless specifically indicated in ROS section below Review of Systems  Constitutional:  Positive for fatigue. Negative for fever.  HENT:  Negative for congestion.   Eyes:  Negative for pain.  Respiratory:  Negative for cough and shortness of breath.   Cardiovascular:  Negative for  chest pain, palpitations and leg swelling.  Gastrointestinal:  Positive for diarrhea. Negative for abdominal pain.  Genitourinary:  Negative for dysuria and vaginal bleeding.  Musculoskeletal:  Negative for back pain.  Neurological:  Positive for weakness, light-headedness and numbness. Negative for syncope and headaches.  Psychiatric/Behavioral:  Negative for dysphoric mood. The patient is nervous/anxious.    Objective:  Ht 5' 3 (1.6 m)   LMP 12/01/2012 (Exact Date)   BMI 24.87 kg/m   Wt Readings from Last 3 Encounters:  03/18/23 140 lb 6 oz (63.7 kg)  03/10/23 149 lb (67.6 kg)  02/03/23 142 lb 12.8 oz (64.8 kg)      Physical Exam Constitutional:      General: She is not in acute distress.     Appearance: Normal appearance. She is well-developed. She is not ill-appearing or toxic-appearing.  HENT:     Head: Normocephalic.     Right Ear: Hearing, tympanic membrane, ear canal and external ear normal. No middle ear effusion. There is impacted cerumen. Tympanic membrane is not erythematous, retracted or bulging.     Left Ear: Hearing, tympanic membrane, ear canal and external ear normal.  No middle ear effusion. There is impacted cerumen. Tympanic membrane is not erythematous, retracted or bulging.     Ears:     Comments: Bilateral  external ear and TMs clear after irrigation    Nose: No mucosal edema or rhinorrhea.     Right Sinus: No maxillary sinus tenderness or frontal sinus tenderness.     Left Sinus: No maxillary sinus tenderness or frontal sinus tenderness.     Mouth/Throat:     Pharynx: Uvula midline.  Eyes:     General: Lids are normal. Lids are everted, no foreign bodies appreciated.     Conjunctiva/sclera: Conjunctivae normal.     Pupils: Pupils are equal, round, and reactive to light.  Neck:     Thyroid: No thyroid mass or thyromegaly.     Vascular: No carotid bruit.     Trachea: Trachea normal.  Cardiovascular:     Rate and Rhythm: Normal rate and regular rhythm.     Pulses: Normal pulses.     Heart sounds: Normal heart sounds, S1 normal and S2 normal. No murmur heard.    No friction rub. No gallop.  Pulmonary:     Effort: Pulmonary effort is normal. No tachypnea or respiratory distress.     Breath sounds: Normal breath sounds. No decreased breath sounds, wheezing, rhonchi or rales.  Abdominal:     General: Bowel sounds are normal.     Palpations: Abdomen is soft.     Tenderness: There is no abdominal tenderness.  Musculoskeletal:     Cervical back: Normal range of motion and neck supple.  Skin:    General: Skin is warm and dry.     Findings: No rash.  Neurological:     Mental Status: She is alert.  Psychiatric:        Mood and Affect: Mood is not anxious  or depressed.        Speech: Speech normal.        Behavior: Behavior normal. Behavior is cooperative.        Thought Content: Thought content normal.        Judgment: Judgment normal.       Results for orders placed or performed during the hospital encounter of 02/01/24  Gastrointestinal Panel by PCR , Stool   Collection Time: 02/01/24 10:45 AM   Specimen: Stool  Result Value Ref Range   Campylobacter species NOT DETECTED NOT DETECTED   Plesimonas shigelloides NOT DETECTED NOT DETECTED   Salmonella species NOT DETECTED NOT DETECTED   Yersinia enterocolitica NOT DETECTED NOT DETECTED   Vibrio species NOT DETECTED NOT DETECTED   Vibrio cholerae NOT DETECTED NOT DETECTED   Enteroaggregative E coli (EAEC) NOT DETECTED NOT DETECTED   Enteropathogenic E coli (EPEC) NOT DETECTED NOT DETECTED   Enterotoxigenic E coli (ETEC) NOT DETECTED NOT DETECTED   Shiga like toxin producing E coli (STEC) NOT DETECTED NOT DETECTED   Shigella/Enteroinvasive E coli (EIEC) NOT DETECTED NOT DETECTED   Cryptosporidium NOT DETECTED NOT DETECTED   Cyclospora cayetanensis NOT DETECTED NOT DETECTED   Entamoeba histolytica NOT DETECTED NOT DETECTED   Giardia lamblia NOT DETECTED NOT DETECTED   Adenovirus F40/41 NOT DETECTED NOT DETECTED   Astrovirus NOT DETECTED NOT DETECTED   Norovirus GI/GII NOT DETECTED NOT DETECTED   Rotavirus A NOT DETECTED NOT DETECTED   Sapovirus (I, II, IV, and V) NOT DETECTED NOT DETECTED  C Difficile Quick Screen w PCR reflex   Collection Time: 02/01/24 10:45 AM   Specimen: STOOL  Result Value Ref Range   C Diff antigen NEGATIVE NEGATIVE   C Diff toxin NEGATIVE NEGATIVE   C Diff interpretation No C. difficile detected.   H. pylori antigen, stool   Collection Time: 02/01/24 10:45 AM  Result Value Ref Range   H. Pylori Stool Ag, Eia Negative Negative   Source of Sample STOOL     Assessment and Plan  There are no diagnoses linked to this encounter.  No follow-ups on file.    Greig Ring, MD

## 2024-03-23 ENCOUNTER — Ambulatory Visit: Payer: PRIVATE HEALTH INSURANCE | Admitting: Physician Assistant

## 2024-03-28 ENCOUNTER — Ambulatory Visit: Payer: Self-pay | Admitting: Family

## 2024-04-06 ENCOUNTER — Ambulatory Visit: Payer: Self-pay | Admitting: Family
